# Patient Record
Sex: Male | Born: 1945 | Race: White | Hispanic: No | Marital: Married | Smoking: Never smoker
Health system: Southern US, Community
[De-identification: ages and names within clinical notes are randomized; demographics above are authoritative.]

## PROBLEM LIST (undated history)

## (undated) DIAGNOSIS — M75102 Unspecified rotator cuff tear or rupture of left shoulder, not specified as traumatic: Secondary | ICD-10-CM

## (undated) DIAGNOSIS — N189 Chronic kidney disease, unspecified: Secondary | ICD-10-CM

## (undated) DIAGNOSIS — E785 Hyperlipidemia, unspecified: Secondary | ICD-10-CM

## (undated) DIAGNOSIS — M5416 Radiculopathy, lumbar region: Secondary | ICD-10-CM

## (undated) DIAGNOSIS — R030 Elevated blood-pressure reading, without diagnosis of hypertension: Secondary | ICD-10-CM

## (undated) DIAGNOSIS — D649 Anemia, unspecified: Secondary | ICD-10-CM

## (undated) HISTORY — DX: Hyperlipidemia, unspecified: E78.5

## (undated) HISTORY — PX: CARDIAC CATHETERIZATION: SHX172

## (undated) HISTORY — DX: Elevated blood-pressure reading, without diagnosis of hypertension: R03.0

## (undated) HISTORY — DX: Radiculopathy, lumbar region: M54.16

## (undated) HISTORY — DX: Anemia, unspecified: D64.9

## (undated) HISTORY — DX: Chronic kidney disease, unspecified: N18.9

## (undated) HISTORY — PX: COLONOSCOPY: SHX174

---

## 2001-10-26 ENCOUNTER — Ambulatory Visit (HOSPITAL_COMMUNITY): Admission: RE | Admit: 2001-10-26 | Discharge: 2001-10-26 | Payer: Self-pay | Admitting: Gastroenterology

## 2011-06-16 DIAGNOSIS — M719 Bursopathy, unspecified: Secondary | ICD-10-CM | POA: Diagnosis not present

## 2011-06-16 DIAGNOSIS — M999 Biomechanical lesion, unspecified: Secondary | ICD-10-CM | POA: Diagnosis not present

## 2011-06-16 DIAGNOSIS — M531 Cervicobrachial syndrome: Secondary | ICD-10-CM | POA: Diagnosis not present

## 2011-06-16 DIAGNOSIS — IMO0001 Reserved for inherently not codable concepts without codable children: Secondary | ICD-10-CM | POA: Diagnosis not present

## 2011-06-16 DIAGNOSIS — M9981 Other biomechanical lesions of cervical region: Secondary | ICD-10-CM | POA: Diagnosis not present

## 2011-06-18 DIAGNOSIS — M531 Cervicobrachial syndrome: Secondary | ICD-10-CM | POA: Diagnosis not present

## 2011-06-18 DIAGNOSIS — M67919 Unspecified disorder of synovium and tendon, unspecified shoulder: Secondary | ICD-10-CM | POA: Diagnosis not present

## 2011-06-18 DIAGNOSIS — IMO0001 Reserved for inherently not codable concepts without codable children: Secondary | ICD-10-CM | POA: Diagnosis not present

## 2011-06-18 DIAGNOSIS — M9981 Other biomechanical lesions of cervical region: Secondary | ICD-10-CM | POA: Diagnosis not present

## 2011-06-18 DIAGNOSIS — M999 Biomechanical lesion, unspecified: Secondary | ICD-10-CM | POA: Diagnosis not present

## 2011-06-23 DIAGNOSIS — M9981 Other biomechanical lesions of cervical region: Secondary | ICD-10-CM | POA: Diagnosis not present

## 2011-06-23 DIAGNOSIS — M67919 Unspecified disorder of synovium and tendon, unspecified shoulder: Secondary | ICD-10-CM | POA: Diagnosis not present

## 2011-06-23 DIAGNOSIS — M719 Bursopathy, unspecified: Secondary | ICD-10-CM | POA: Diagnosis not present

## 2011-06-23 DIAGNOSIS — M531 Cervicobrachial syndrome: Secondary | ICD-10-CM | POA: Diagnosis not present

## 2011-06-23 DIAGNOSIS — M999 Biomechanical lesion, unspecified: Secondary | ICD-10-CM | POA: Diagnosis not present

## 2011-06-23 DIAGNOSIS — IMO0001 Reserved for inherently not codable concepts without codable children: Secondary | ICD-10-CM | POA: Diagnosis not present

## 2011-06-25 DIAGNOSIS — M719 Bursopathy, unspecified: Secondary | ICD-10-CM | POA: Diagnosis not present

## 2011-06-25 DIAGNOSIS — IMO0001 Reserved for inherently not codable concepts without codable children: Secondary | ICD-10-CM | POA: Diagnosis not present

## 2011-06-25 DIAGNOSIS — M531 Cervicobrachial syndrome: Secondary | ICD-10-CM | POA: Diagnosis not present

## 2011-06-25 DIAGNOSIS — M999 Biomechanical lesion, unspecified: Secondary | ICD-10-CM | POA: Diagnosis not present

## 2011-06-25 DIAGNOSIS — M9981 Other biomechanical lesions of cervical region: Secondary | ICD-10-CM | POA: Diagnosis not present

## 2011-06-30 DIAGNOSIS — M67919 Unspecified disorder of synovium and tendon, unspecified shoulder: Secondary | ICD-10-CM | POA: Diagnosis not present

## 2011-06-30 DIAGNOSIS — M999 Biomechanical lesion, unspecified: Secondary | ICD-10-CM | POA: Diagnosis not present

## 2011-06-30 DIAGNOSIS — M9981 Other biomechanical lesions of cervical region: Secondary | ICD-10-CM | POA: Diagnosis not present

## 2011-06-30 DIAGNOSIS — M719 Bursopathy, unspecified: Secondary | ICD-10-CM | POA: Diagnosis not present

## 2011-06-30 DIAGNOSIS — M531 Cervicobrachial syndrome: Secondary | ICD-10-CM | POA: Diagnosis not present

## 2011-06-30 DIAGNOSIS — IMO0001 Reserved for inherently not codable concepts without codable children: Secondary | ICD-10-CM | POA: Diagnosis not present

## 2011-07-07 DIAGNOSIS — M719 Bursopathy, unspecified: Secondary | ICD-10-CM | POA: Diagnosis not present

## 2011-07-07 DIAGNOSIS — M531 Cervicobrachial syndrome: Secondary | ICD-10-CM | POA: Diagnosis not present

## 2011-07-07 DIAGNOSIS — M9981 Other biomechanical lesions of cervical region: Secondary | ICD-10-CM | POA: Diagnosis not present

## 2011-07-07 DIAGNOSIS — M999 Biomechanical lesion, unspecified: Secondary | ICD-10-CM | POA: Diagnosis not present

## 2011-07-07 DIAGNOSIS — M67919 Unspecified disorder of synovium and tendon, unspecified shoulder: Secondary | ICD-10-CM | POA: Diagnosis not present

## 2011-07-07 DIAGNOSIS — IMO0001 Reserved for inherently not codable concepts without codable children: Secondary | ICD-10-CM | POA: Diagnosis not present

## 2011-07-09 DIAGNOSIS — M531 Cervicobrachial syndrome: Secondary | ICD-10-CM | POA: Diagnosis not present

## 2011-07-09 DIAGNOSIS — M719 Bursopathy, unspecified: Secondary | ICD-10-CM | POA: Diagnosis not present

## 2011-07-09 DIAGNOSIS — M999 Biomechanical lesion, unspecified: Secondary | ICD-10-CM | POA: Diagnosis not present

## 2011-07-09 DIAGNOSIS — M9981 Other biomechanical lesions of cervical region: Secondary | ICD-10-CM | POA: Diagnosis not present

## 2011-07-09 DIAGNOSIS — M67919 Unspecified disorder of synovium and tendon, unspecified shoulder: Secondary | ICD-10-CM | POA: Diagnosis not present

## 2011-07-09 DIAGNOSIS — IMO0001 Reserved for inherently not codable concepts without codable children: Secondary | ICD-10-CM | POA: Diagnosis not present

## 2011-07-14 DIAGNOSIS — M999 Biomechanical lesion, unspecified: Secondary | ICD-10-CM | POA: Diagnosis not present

## 2011-07-14 DIAGNOSIS — M531 Cervicobrachial syndrome: Secondary | ICD-10-CM | POA: Diagnosis not present

## 2011-07-14 DIAGNOSIS — M9981 Other biomechanical lesions of cervical region: Secondary | ICD-10-CM | POA: Diagnosis not present

## 2011-07-14 DIAGNOSIS — IMO0001 Reserved for inherently not codable concepts without codable children: Secondary | ICD-10-CM | POA: Diagnosis not present

## 2011-07-14 DIAGNOSIS — M719 Bursopathy, unspecified: Secondary | ICD-10-CM | POA: Diagnosis not present

## 2011-07-21 DIAGNOSIS — M67919 Unspecified disorder of synovium and tendon, unspecified shoulder: Secondary | ICD-10-CM | POA: Diagnosis not present

## 2011-07-21 DIAGNOSIS — M9981 Other biomechanical lesions of cervical region: Secondary | ICD-10-CM | POA: Diagnosis not present

## 2011-07-21 DIAGNOSIS — IMO0001 Reserved for inherently not codable concepts without codable children: Secondary | ICD-10-CM | POA: Diagnosis not present

## 2011-07-21 DIAGNOSIS — M999 Biomechanical lesion, unspecified: Secondary | ICD-10-CM | POA: Diagnosis not present

## 2011-07-21 DIAGNOSIS — M719 Bursopathy, unspecified: Secondary | ICD-10-CM | POA: Diagnosis not present

## 2011-07-21 DIAGNOSIS — M531 Cervicobrachial syndrome: Secondary | ICD-10-CM | POA: Diagnosis not present

## 2011-07-28 DIAGNOSIS — M719 Bursopathy, unspecified: Secondary | ICD-10-CM | POA: Diagnosis not present

## 2011-07-28 DIAGNOSIS — M531 Cervicobrachial syndrome: Secondary | ICD-10-CM | POA: Diagnosis not present

## 2011-07-28 DIAGNOSIS — M9981 Other biomechanical lesions of cervical region: Secondary | ICD-10-CM | POA: Diagnosis not present

## 2011-07-28 DIAGNOSIS — IMO0001 Reserved for inherently not codable concepts without codable children: Secondary | ICD-10-CM | POA: Diagnosis not present

## 2011-07-28 DIAGNOSIS — M999 Biomechanical lesion, unspecified: Secondary | ICD-10-CM | POA: Diagnosis not present

## 2011-07-28 DIAGNOSIS — M67919 Unspecified disorder of synovium and tendon, unspecified shoulder: Secondary | ICD-10-CM | POA: Diagnosis not present

## 2011-08-11 DIAGNOSIS — IMO0001 Reserved for inherently not codable concepts without codable children: Secondary | ICD-10-CM | POA: Diagnosis not present

## 2011-08-11 DIAGNOSIS — M999 Biomechanical lesion, unspecified: Secondary | ICD-10-CM | POA: Diagnosis not present

## 2011-08-11 DIAGNOSIS — M719 Bursopathy, unspecified: Secondary | ICD-10-CM | POA: Diagnosis not present

## 2011-08-11 DIAGNOSIS — M9981 Other biomechanical lesions of cervical region: Secondary | ICD-10-CM | POA: Diagnosis not present

## 2011-08-11 DIAGNOSIS — M531 Cervicobrachial syndrome: Secondary | ICD-10-CM | POA: Diagnosis not present

## 2011-08-11 DIAGNOSIS — M67919 Unspecified disorder of synovium and tendon, unspecified shoulder: Secondary | ICD-10-CM | POA: Diagnosis not present

## 2011-09-01 DIAGNOSIS — M999 Biomechanical lesion, unspecified: Secondary | ICD-10-CM | POA: Diagnosis not present

## 2011-09-01 DIAGNOSIS — M9981 Other biomechanical lesions of cervical region: Secondary | ICD-10-CM | POA: Diagnosis not present

## 2011-09-01 DIAGNOSIS — IMO0001 Reserved for inherently not codable concepts without codable children: Secondary | ICD-10-CM | POA: Diagnosis not present

## 2011-09-01 DIAGNOSIS — M531 Cervicobrachial syndrome: Secondary | ICD-10-CM | POA: Diagnosis not present

## 2011-09-01 DIAGNOSIS — M719 Bursopathy, unspecified: Secondary | ICD-10-CM | POA: Diagnosis not present

## 2011-09-08 ENCOUNTER — Observation Stay (HOSPITAL_COMMUNITY)
Admission: EM | Admit: 2011-09-08 | Discharge: 2011-09-08 | Disposition: A | Discharge status: Home / Self Care | Payer: Medicare Other | Attending: Emergency Medicine | Admitting: Emergency Medicine

## 2011-09-08 ENCOUNTER — Emergency Department (HOSPITAL_COMMUNITY): Payer: Medicare Other

## 2011-09-08 ENCOUNTER — Encounter (HOSPITAL_COMMUNITY): Payer: Self-pay | Admitting: *Deleted

## 2011-09-08 DIAGNOSIS — R0789 Other chest pain: Secondary | ICD-10-CM | POA: Diagnosis not present

## 2011-09-08 DIAGNOSIS — R079 Chest pain, unspecified: Principal | ICD-10-CM | POA: Insufficient documentation

## 2011-09-08 DIAGNOSIS — R9431 Abnormal electrocardiogram [ECG] [EKG]: Secondary | ICD-10-CM | POA: Diagnosis not present

## 2011-09-08 DIAGNOSIS — R072 Precordial pain: Secondary | ICD-10-CM | POA: Diagnosis not present

## 2011-09-08 LAB — BASIC METABOLIC PANEL
BUN: 25 mg/dL — ABNORMAL HIGH (ref 6–23)
CO2: 27 mEq/L (ref 19–32)
Chloride: 99 mEq/L (ref 96–112)
Creatinine, Ser: 1.19 mg/dL (ref 0.50–1.35)
GFR calc Af Amer: 72 mL/min — ABNORMAL LOW (ref >90)
Glucose, Bld: 107 mg/dL — ABNORMAL HIGH (ref 70–99)
Potassium: 3.9 mEq/L (ref 3.5–5.1)

## 2011-09-08 LAB — CBC
HCT: 40 % (ref 39.0–52.0)
Hemoglobin: 14.1 g/dL (ref 13.0–17.0)
MCV: 90.1 fL (ref 78.0–100.0)
RBC: 4.44 MIL/uL (ref 4.22–5.81)
RDW: 11.7 % (ref 11.5–15.5)
WBC: 7.5 10*3/uL (ref 4.0–10.5)

## 2011-09-08 LAB — CARDIAC PANEL(CRET KIN+CKTOT+MB+TROPI)
CK, MB: 2 ng/mL (ref 0.3–4.0)
Relative Index: INVALID (ref 0.0–2.5)
Total CK: 85 U/L (ref 7–232)
Troponin I: <0.3 ng/mL (ref <0.30)

## 2011-09-08 LAB — PRO B NATRIURETIC PEPTIDE: Pro B Natriuretic peptide (BNP): 88.4 pg/mL (ref 0–125)

## 2011-09-08 MED ORDER — IBUPROFEN 600 MG PO TABS: 600.0000 mg | ORAL_TABLET | Freq: Four times a day (QID) | ORAL | Status: AC | PRN

## 2011-09-08 MED ORDER — ASPIRIN 81 MG PO CHEW: 324.0000 mg | CHEWABLE_TABLET | Freq: Once | ORAL | Status: DC

## 2011-09-08 NOTE — ED Notes (Signed)
Complaining of pain located in sternum. Denies chest pain. States went to PCP at which time had EKG done. Describes as intermittent, pulling pain with movement. Started at Friday morning. Waited for Monday for physician office to open. Denies pain at this time

## 2011-09-08 NOTE — ED Provider Notes (Signed)
History     CSN: [id number]  Arrival date & time 09/08/11  1618   First MD Initiated Contact with Patient 09/08/11 1636      Chief Complaint  Patient presents with  . Chest Pain    (Consider location/radiation/quality/duration/timing/severity/associated sxs/prior treatment) Patient is a 66 y.o. male presenting with chest pain. The history is provided by the patient, the EMS personnel and medical records.  Chest Pain The chest pain began 3 - 5 days ago (6/28). Chest pain occurs intermittently. The chest pain is unchanged. At its most intense, the pain is at 4/10. The pain is currently at 0/10. The quality of the pain is described as pressure-like. The pain does not radiate. Chest pain is worsened by certain positions. Pertinent negatives for primary symptoms include no fever, no fatigue, no shortness of breath, no cough, no palpitations, no abdominal pain, no nausea, no vomiting and no dizziness.  Pertinent negatives for associated symptoms include no diaphoresis, no near-syncope, no numbness, no paroxysmal nocturnal dyspnea and no weakness. He tried nothing for the symptoms. Risk factors include male gender.  Pertinent negatives for past medical history include no CAD, no DVT and no PE.  Pertinent negatives for family medical history include: no early MI in family.  Procedure history is positive for cardiac catheterization (negative 12 years ago) and exercise treadmill test (12 years ago; abnormal EKG findings).     History reviewed. No pertinent past medical history.  History reviewed. No pertinent past surgical history.  No family history on file.  History  Substance Use Topics  . Smoking status: Never Smoker   . Smokeless tobacco: Not on file  . Alcohol Use: No      Review of Systems  Unable to perform ROS Constitutional: Negative for fever, chills, diaphoresis and fatigue.  HENT: Negative for congestion and rhinorrhea.   Respiratory: Negative for cough, chest tightness  and shortness of breath.   Cardiovascular: Positive for chest pain. Negative for palpitations, leg swelling and near-syncope.  Gastrointestinal: Negative for nausea, vomiting and abdominal pain.  Musculoskeletal: Negative for myalgias and back pain.  Skin: Negative for color change and rash.  Neurological: Negative for dizziness, weakness, light-headedness, numbness and headaches.  All other systems reviewed and are negative.    Allergies  Review of patient's allergies indicates no known allergies.  Home Medications  No current outpatient prescriptions on file.  BP 138/86  Temp 98.4 F (36.9 C) (Oral)  Resp 18  Wt 145 lb (65.772 kg)  SpO2 99%  Physical Exam  Nursing note and vitals reviewed. Constitutional: He is oriented to person, place, and time. He appears well-developed and well-nourished.  HENT:  Head: Normocephalic and atraumatic.  Eyes: EOM are normal. Pupils are equal, round, and reactive to light.  Cardiovascular: Normal rate, regular rhythm, normal heart sounds and intact distal pulses.   Pulmonary/Chest: Effort normal and breath sounds normal. No respiratory distress. He exhibits no tenderness.  Abdominal: Soft. There is no tenderness.  Musculoskeletal: He exhibits no edema.  Neurological: He is alert and oriented to person, place, and time.  Skin: Skin is warm and dry.  Psychiatric: He has a normal mood and affect.    ED Course  Procedures (including critical care time)  Labs Reviewed  BASIC METABOLIC PANEL - Abnormal; Notable for the following:    Glucose, Bld 107 (*)     BUN 25 (*)     GFR calc non Af Amer 62 (*)     GFR calc Af Denyse Dago  72 (*)     All other components within normal limits  SEDIMENTATION RATE - Abnormal; Notable for the following:    Sed Rate 59 (*)     All other components within normal limits  CBC  CARDIAC PANEL(CRET KIN+CKTOT+MB+TROPI)  PRO B NATRIURETIC PEPTIDE  CARDIAC PANEL(CRET KIN+CKTOT+MB+TROPI)   Dg Chest 2 View  09/08/2011   *RADIOLOGY REPORT*  Clinical Data: Mid chest pain.  Abnormal EKG.  CHEST - 2 VIEW 5:25 p.m.  Comparison: 09/08/2011 at 3:00 p.m.  Findings: Heart size and vascularity are normal and the lungs are clear.  No acute osseous abnormality.  IMPRESSION: No acute disease.  Original Report Authenticated By: Gwynn Burly, M.D.     1. Chest pain      Date: 09/08/2011  Rate: 98  Rhythm: normal sinus rhythm  QRS Axis: normal  Intervals: normal  ST/T Wave abnormalities: ST elevations inferiorly ~1 mm leads II, III, aVF  Conduction Disutrbances:none  Narrative Interpretation: NSR, mild ST elevations in inferior leads, no reciprocal changes  Old EKG Reviewed: none available     MDM  66 yo M with no past medical history presents with intermittent CP since 6/28. Says happens often spontaneously, sometimes with movements in certain directions, lasts for a couple of minutes before resolving spontaneously, occasionally relieved by sitting up straight or standing. Is mid-sternal pressure, at worst 4-5/10, non radiating, with no other associated symptoms. Has never had previous similar pain before. Says ~12 years ago was having a treadmill test where they were looking at an EKG (sounds like stress test, but unknown reason, b/c wasn't having any CP or SOB) and was told he had an abnormal EKG, so underwent a cath, which was negative. Did not have any interventions performed, and does not currently see a cardiologist. Denies any recent cough/congestion or other URI sxs, no SOB, no exertional or pleuritic component, is unable to reproduce with palpation, no N/V abd pain, or any other complaints. Was seen by his PCP today, where an EKG in clinic showed mild elevations in inferior leads, so pt was sent to ED for further evaluation. Currently is CP free. Exam is unremarkable; pain is not reproducible with palpation or movement. Repeat EKG here as above; though has mild STE, no reciprocal changes and pt pain free at this  time, Code STEMI was not called. Story atypical for ACS. Will give asa, check labs, CXR and consult cardiology (Dr Katrinka Blazing with [facility] cardiology notified of pt prior to transfer here).   Dr Katrinka Blazing has seen patient, and do not think pt is have ACS. Feel that by story, is more consistent with inflammatory process (ie: pericarditis). Recommends checking two sets of enzymes, and having pt f/u with him in clinic.  Initial set of cardiac markers unremarkable. Pt remains pain free. Will send to obs while waiting for second set of enzymes. Pt and family updated on plan.      Theotis Burrow, MD 09/09/11 [phone]

## 2011-09-08 NOTE — ED Notes (Signed)
Dr. Verdis Prime was notified of patient arrival

## 2011-09-08 NOTE — ED Notes (Signed)
Patient states intermittant chest pain since Friday, patient at MD office today and post EKG was brought to hospital via EMS for abnormal EKG,  Patient states no pain at this time, patient with +PMS in all extremities, patient in NSR at this time,

## 2011-09-08 NOTE — ED Notes (Signed)
Cards MD at bedside

## 2011-09-08 NOTE — ED Notes (Signed)
   Admit date: 09/08/2011 Referring Physician  George Gosselin, MD Primary Physician  Same Primary Cardiologist  George Both, III, MD Reason for Consultation : Chest pain  ASSESSMENT: 1. Chest pain, with pleuritic and musculoskeletal features.  2. EKG with 1/2 mm of ST elevation in 2, 3, and aVF without reciprocal changes  3. History of a syncopal episode in April 2012 with associated small subarachnoid hemorrhage due to trauma. Cardiac workup at that time including echocardiogram was "normal". No EKGs are available.  4. Hyperlipidemia  PLAN: 1. Serial point-of-care cardiac markers to rule out evidence of injury.  2. Repeat EKG in approximately one hour.  3. If EKG and markers unremarkable, given the patient's history no further ischemic evaluation seems indicated.  4. Since we are concerned about the possibility of an inflammatory process, possibly  pericarditis, consideration of an outpatient echocardiogram is reasonable.  5. The patient needs a course of anti-inflammatory therapy, perhaps ibuprofen (400 mg) or Indocin (50 mg) 3 times per day with food for 5-7 days.   HPI: This is a very nice gentleman who presents with a three-day history of localized sternal discomfort. He classifies it as a sharp pain that is more noticeable when he is lying down. It is even worse when he lays on his stomach. Standing and changing positions causes it to go away. Activity does not precipitate discomfort. There is no chest wall tenderness. He denies chills and fever. There is no radiation. He denies dyspnea. There is no diaphoresis. When the episodes occur they last minutes and is relieved by positional changes.   PMH:  1. White count hypertension; 2. Hyperlipidemia; 3. Syncopal episode with head trauma, 2012  PSH:  Cardiac catheterization 1999, no abnormalities found  Allergies: None Prior to Admit Meds: 1. Omega-3 fish oil                                      2.  81 mg aspirin daily. Fam HX:    Mother died of breast cancer age 59. Father died of "old age. Social HX:   Doesn't smoke. Doesn't drink. 3 children. Married. Retired. Review of Systems: Unremarkable  Physical Exam: Blood pressure 134/81, pulse 89, temperature 98.4 F (36.9 C), temperature source Oral, resp. rate 20, weight 65.772 kg (145 lb), SpO2 97.00%. Weight change:   Well-developed well-nourished.  Skin is warm and dry. Color is good.  No carotid bruits are heard.  Lungs are clear auscultation and percussion.  Cardiac exam is unremarkable. No rub, murmur, or gallop is heard.  Abdomen is soft without bruits.  Extremities reveal no edema. Pulses and sensation are equal bilaterally in both upper and lower extremities.  Neurological exam is intact  Labs: Point-of-care markers are pending     Radiology:  Dg Chest 2 View  09/08/2011  *RADIOLOGY REPORT*  Clinical Data: Mid chest pain.  Abnormal EKG.  CHEST - 2 VIEW 5:25 p.m.  Comparison: 09/08/2011 at 3:00 p.m.  Findings: Heart size and vascularity are normal and the lungs are clear.  No acute osseous abnormality.  IMPRESSION: No acute disease.  Original Report Authenticated By: Gwynn Burly, M.D.   EKG: Normal sinus rhythm. 1/2 mm ST elevation 23 and aVF. Sinus tachycardia.    George Mckay 09/08/2011 6:09 PM

## 2011-09-08 NOTE — ED Provider Notes (Signed)
8:30 PM Assumed care of patient in the CDU from Dr. Leary Roca and Dr Rosalia Hammers.  Patient presented to the ED with a chief complaint of midsternal chest pain that has been present for the past 4 days.  EKG findings showed mild ST elevation in II, III, and AVF.  Initial cardiac markers negative.  Plan is for patient to be discharged home with Cardiology follow up if second set of markers are negative.  Patient has been evaluated by Dr. Katrinka Blazing with [facility] in the ED prior to arrival in the CDU.  Reassessed patient.  He is not having any chest pain at this time.  Patient alert and orientated x 3, Heart RRR, Lungs CTAB, no LE edema, DP pulses 2+ bilaterally.  Patient currently awaiting second set of cardiac markers.  10:30 PM Second sets of cardiac markers are negative.  Patient continues to be chest pain free.  Will discharge patient home with follow up with Dr. Katrinka Blazing with [facility].  Patient in agreement with the plan.  Return precautions discussed.    Pascal Lux [place], PA-C 09/09/11 0005

## 2011-09-08 NOTE — ED Notes (Signed)
Patient in NAD at this time, denies chest pain, patient with +PMS in all extremities.

## 2011-09-08 NOTE — Discharge Instructions (Signed)
Follow up with Dr. Katrinka Blazing with Cardiology.  Call tomorrow and make an appointment.  Read instructions below for reasons to return to the Emergency Department. It is recommended that your follow up with your Primary Care Doctor in regards to today's visit. If you do not have a doctor, use the resource guide listed below to help you find one.   Chest Pain (Nonspecific)  HOME CARE INSTRUCTIONS  For the next few days, avoid physical activities that bring on chest pain. Continue physical activities as directed.  Do not smoke cigarettes or drink alcohol until your symptoms are gone.  Only take over-the-counter or prescription medicine for pain, discomfort, or fever as directed by your caregiver.  Follow your caregiver's suggestions for further testing if your chest pain does not go away.  Keep any follow-up appointments you made. If you do not go to an appointment, you could develop lasting (chronic) problems with pain. If there is any problem keeping an appointment, you must call to reschedule.  SEEK MEDICAL CARE IF:  You think you are having problems from the medicine you are taking. Read your medicine instructions carefully.  Your chest pain does not go away, even after treatment.  You develop a rash with blisters on your chest.  SEEK IMMEDIATE MEDICAL CARE IF:  You have increased chest pain or pain that spreads to your arm, neck, jaw, back, or belly (abdomen).  You develop shortness of breath, an increasing cough, or you are coughing up blood.  You have severe back or abdominal pain, feel sick to your stomach (nauseous) or throw up (vomit).  You develop severe weakness, fainting, or chills.  You have an oral temperature above 102 F (38.9 C), not controlled by medicine.   THIS IS AN EMERGENCY. Do not wait to see if the pain will go away. Get medical help at once. Call your local emergency services (911 in [place] Do not drive yourself to the hospital.

## 2011-09-08 NOTE — ED Notes (Signed)
Cardiology MD to bedside.  Pt to x-ray.

## 2011-09-08 NOTE — ED Notes (Signed)
MD to bedside.

## 2011-09-09 DIAGNOSIS — R0602 Shortness of breath: Secondary | ICD-10-CM | POA: Diagnosis not present

## 2011-09-12 NOTE — ED Provider Notes (Signed)
  I performed a history and physical examination of George Mckay and discussed his management with Dr. Leary Roca and Mendel Ryder.  I agree with the history, physical, assessment, and plan of care, with the following exceptions: None  I was present for the following procedures: None Time Spent in Critical Care of the patient: None Time spent in discussions with the patient and family: 10  Jeanni Allshouse Corlis Leak, MD 09/12/11 [phone]

## 2011-09-12 NOTE — ED Provider Notes (Signed)
History/physical exam/procedure(s) were performed by non-physician practitioner and as supervising physician I was immediately available for consultation/collaboration. I have reviewed all notes and am in agreement with care and plan.   Hilario Quarry, MD 09/12/11 [phone]

## 2011-10-20 DIAGNOSIS — R071 Chest pain on breathing: Secondary | ICD-10-CM | POA: Diagnosis not present

## 2011-10-22 DIAGNOSIS — Z09 Encounter for follow-up examination after completed treatment for conditions other than malignant neoplasm: Secondary | ICD-10-CM | POA: Diagnosis not present

## 2011-10-22 DIAGNOSIS — Z86010 Personal history of colonic polyps: Secondary | ICD-10-CM | POA: Diagnosis not present

## 2011-11-24 DIAGNOSIS — M9981 Other biomechanical lesions of cervical region: Secondary | ICD-10-CM | POA: Diagnosis not present

## 2011-11-24 DIAGNOSIS — M999 Biomechanical lesion, unspecified: Secondary | ICD-10-CM | POA: Diagnosis not present

## 2011-11-24 DIAGNOSIS — S139XXA Sprain of joints and ligaments of unspecified parts of neck, initial encounter: Secondary | ICD-10-CM | POA: Diagnosis not present

## 2012-01-15 DIAGNOSIS — Z0000 Encounter for general adult medical examination without abnormal findings: Secondary | ICD-10-CM | POA: Diagnosis not present

## 2012-01-15 DIAGNOSIS — Z79899 Other long term (current) drug therapy: Secondary | ICD-10-CM | POA: Diagnosis not present

## 2012-01-15 DIAGNOSIS — Z125 Encounter for screening for malignant neoplasm of prostate: Secondary | ICD-10-CM | POA: Diagnosis not present

## 2012-01-15 DIAGNOSIS — Z23 Encounter for immunization: Secondary | ICD-10-CM | POA: Diagnosis not present

## 2012-01-15 DIAGNOSIS — Z1331 Encounter for screening for depression: Secondary | ICD-10-CM | POA: Diagnosis not present

## 2012-01-15 DIAGNOSIS — E785 Hyperlipidemia, unspecified: Secondary | ICD-10-CM | POA: Diagnosis not present

## 2012-01-29 DIAGNOSIS — R799 Abnormal finding of blood chemistry, unspecified: Secondary | ICD-10-CM | POA: Diagnosis not present

## 2012-04-29 DIAGNOSIS — L723 Sebaceous cyst: Secondary | ICD-10-CM | POA: Diagnosis not present

## 2012-04-29 DIAGNOSIS — L719 Rosacea, unspecified: Secondary | ICD-10-CM | POA: Diagnosis not present

## 2012-06-01 DIAGNOSIS — M999 Biomechanical lesion, unspecified: Secondary | ICD-10-CM | POA: Diagnosis not present

## 2012-06-01 DIAGNOSIS — M9981 Other biomechanical lesions of cervical region: Secondary | ICD-10-CM | POA: Diagnosis not present

## 2012-06-01 DIAGNOSIS — M48061 Spinal stenosis, lumbar region without neurogenic claudication: Secondary | ICD-10-CM | POA: Diagnosis not present

## 2012-06-01 DIAGNOSIS — M531 Cervicobrachial syndrome: Secondary | ICD-10-CM | POA: Diagnosis not present

## 2012-06-03 DIAGNOSIS — M48061 Spinal stenosis, lumbar region without neurogenic claudication: Secondary | ICD-10-CM | POA: Diagnosis not present

## 2012-06-03 DIAGNOSIS — M531 Cervicobrachial syndrome: Secondary | ICD-10-CM | POA: Diagnosis not present

## 2012-06-03 DIAGNOSIS — M999 Biomechanical lesion, unspecified: Secondary | ICD-10-CM | POA: Diagnosis not present

## 2012-06-03 DIAGNOSIS — M9981 Other biomechanical lesions of cervical region: Secondary | ICD-10-CM | POA: Diagnosis not present

## 2012-06-07 DIAGNOSIS — M531 Cervicobrachial syndrome: Secondary | ICD-10-CM | POA: Diagnosis not present

## 2012-06-07 DIAGNOSIS — M9981 Other biomechanical lesions of cervical region: Secondary | ICD-10-CM | POA: Diagnosis not present

## 2012-06-07 DIAGNOSIS — M48061 Spinal stenosis, lumbar region without neurogenic claudication: Secondary | ICD-10-CM | POA: Diagnosis not present

## 2012-06-07 DIAGNOSIS — M999 Biomechanical lesion, unspecified: Secondary | ICD-10-CM | POA: Diagnosis not present

## 2012-06-10 DIAGNOSIS — M531 Cervicobrachial syndrome: Secondary | ICD-10-CM | POA: Diagnosis not present

## 2012-06-10 DIAGNOSIS — M48061 Spinal stenosis, lumbar region without neurogenic claudication: Secondary | ICD-10-CM | POA: Diagnosis not present

## 2012-06-10 DIAGNOSIS — M999 Biomechanical lesion, unspecified: Secondary | ICD-10-CM | POA: Diagnosis not present

## 2012-06-10 DIAGNOSIS — M9981 Other biomechanical lesions of cervical region: Secondary | ICD-10-CM | POA: Diagnosis not present

## 2012-06-15 DIAGNOSIS — M9981 Other biomechanical lesions of cervical region: Secondary | ICD-10-CM | POA: Diagnosis not present

## 2012-06-15 DIAGNOSIS — M531 Cervicobrachial syndrome: Secondary | ICD-10-CM | POA: Diagnosis not present

## 2012-06-15 DIAGNOSIS — M48061 Spinal stenosis, lumbar region without neurogenic claudication: Secondary | ICD-10-CM | POA: Diagnosis not present

## 2012-06-15 DIAGNOSIS — M999 Biomechanical lesion, unspecified: Secondary | ICD-10-CM | POA: Diagnosis not present

## 2012-06-28 DIAGNOSIS — M25519 Pain in unspecified shoulder: Secondary | ICD-10-CM | POA: Diagnosis not present

## 2012-07-25 DIAGNOSIS — J069 Acute upper respiratory infection, unspecified: Secondary | ICD-10-CM | POA: Diagnosis not present

## 2013-01-20 DIAGNOSIS — N289 Disorder of kidney and ureter, unspecified: Secondary | ICD-10-CM | POA: Diagnosis not present

## 2013-01-20 DIAGNOSIS — Z0000 Encounter for general adult medical examination without abnormal findings: Secondary | ICD-10-CM | POA: Diagnosis not present

## 2013-01-20 DIAGNOSIS — M25519 Pain in unspecified shoulder: Secondary | ICD-10-CM | POA: Diagnosis not present

## 2013-01-20 DIAGNOSIS — E785 Hyperlipidemia, unspecified: Secondary | ICD-10-CM | POA: Diagnosis not present

## 2013-01-20 DIAGNOSIS — Z79899 Other long term (current) drug therapy: Secondary | ICD-10-CM | POA: Diagnosis not present

## 2013-01-20 DIAGNOSIS — Z125 Encounter for screening for malignant neoplasm of prostate: Secondary | ICD-10-CM | POA: Diagnosis not present

## 2013-01-20 DIAGNOSIS — Z1331 Encounter for screening for depression: Secondary | ICD-10-CM | POA: Diagnosis not present

## 2013-01-20 DIAGNOSIS — R7301 Impaired fasting glucose: Secondary | ICD-10-CM | POA: Diagnosis not present

## 2013-01-26 DIAGNOSIS — M67919 Unspecified disorder of synovium and tendon, unspecified shoulder: Secondary | ICD-10-CM | POA: Diagnosis not present

## 2013-08-05 DIAGNOSIS — H25039 Anterior subcapsular polar age-related cataract, unspecified eye: Secondary | ICD-10-CM | POA: Diagnosis not present

## 2013-08-15 DIAGNOSIS — M67919 Unspecified disorder of synovium and tendon, unspecified shoulder: Secondary | ICD-10-CM | POA: Diagnosis not present

## 2013-08-15 DIAGNOSIS — M719 Bursopathy, unspecified: Secondary | ICD-10-CM | POA: Diagnosis not present

## 2013-08-29 DIAGNOSIS — S335XXA Sprain of ligaments of lumbar spine, initial encounter: Secondary | ICD-10-CM | POA: Diagnosis not present

## 2013-08-29 DIAGNOSIS — M999 Biomechanical lesion, unspecified: Secondary | ICD-10-CM | POA: Diagnosis not present

## 2013-08-29 DIAGNOSIS — S239XXA Sprain of unspecified parts of thorax, initial encounter: Secondary | ICD-10-CM | POA: Diagnosis not present

## 2013-08-29 DIAGNOSIS — S338XXA Sprain of other parts of lumbar spine and pelvis, initial encounter: Secondary | ICD-10-CM | POA: Diagnosis not present

## 2013-08-31 DIAGNOSIS — S239XXA Sprain of unspecified parts of thorax, initial encounter: Secondary | ICD-10-CM | POA: Diagnosis not present

## 2013-08-31 DIAGNOSIS — S338XXA Sprain of other parts of lumbar spine and pelvis, initial encounter: Secondary | ICD-10-CM | POA: Diagnosis not present

## 2013-08-31 DIAGNOSIS — M999 Biomechanical lesion, unspecified: Secondary | ICD-10-CM | POA: Diagnosis not present

## 2013-08-31 DIAGNOSIS — S335XXA Sprain of ligaments of lumbar spine, initial encounter: Secondary | ICD-10-CM | POA: Diagnosis not present

## 2013-09-01 DIAGNOSIS — S239XXA Sprain of unspecified parts of thorax, initial encounter: Secondary | ICD-10-CM | POA: Diagnosis not present

## 2013-09-01 DIAGNOSIS — S338XXA Sprain of other parts of lumbar spine and pelvis, initial encounter: Secondary | ICD-10-CM | POA: Diagnosis not present

## 2013-09-01 DIAGNOSIS — S335XXA Sprain of ligaments of lumbar spine, initial encounter: Secondary | ICD-10-CM | POA: Diagnosis not present

## 2013-09-01 DIAGNOSIS — M999 Biomechanical lesion, unspecified: Secondary | ICD-10-CM | POA: Diagnosis not present

## 2013-09-12 DIAGNOSIS — S239XXA Sprain of unspecified parts of thorax, initial encounter: Secondary | ICD-10-CM | POA: Diagnosis not present

## 2013-09-12 DIAGNOSIS — M999 Biomechanical lesion, unspecified: Secondary | ICD-10-CM | POA: Diagnosis not present

## 2013-09-12 DIAGNOSIS — S335XXA Sprain of ligaments of lumbar spine, initial encounter: Secondary | ICD-10-CM | POA: Diagnosis not present

## 2013-09-12 DIAGNOSIS — S338XXA Sprain of other parts of lumbar spine and pelvis, initial encounter: Secondary | ICD-10-CM | POA: Diagnosis not present

## 2013-09-14 DIAGNOSIS — S335XXA Sprain of ligaments of lumbar spine, initial encounter: Secondary | ICD-10-CM | POA: Diagnosis not present

## 2013-09-14 DIAGNOSIS — S338XXA Sprain of other parts of lumbar spine and pelvis, initial encounter: Secondary | ICD-10-CM | POA: Diagnosis not present

## 2013-09-14 DIAGNOSIS — M999 Biomechanical lesion, unspecified: Secondary | ICD-10-CM | POA: Diagnosis not present

## 2013-09-14 DIAGNOSIS — S239XXA Sprain of unspecified parts of thorax, initial encounter: Secondary | ICD-10-CM | POA: Diagnosis not present

## 2013-09-19 DIAGNOSIS — M999 Biomechanical lesion, unspecified: Secondary | ICD-10-CM | POA: Diagnosis not present

## 2013-09-19 DIAGNOSIS — S338XXA Sprain of other parts of lumbar spine and pelvis, initial encounter: Secondary | ICD-10-CM | POA: Diagnosis not present

## 2013-09-19 DIAGNOSIS — S239XXA Sprain of unspecified parts of thorax, initial encounter: Secondary | ICD-10-CM | POA: Diagnosis not present

## 2013-09-19 DIAGNOSIS — S335XXA Sprain of ligaments of lumbar spine, initial encounter: Secondary | ICD-10-CM | POA: Diagnosis not present

## 2013-09-21 DIAGNOSIS — M999 Biomechanical lesion, unspecified: Secondary | ICD-10-CM | POA: Diagnosis not present

## 2013-09-21 DIAGNOSIS — S335XXA Sprain of ligaments of lumbar spine, initial encounter: Secondary | ICD-10-CM | POA: Diagnosis not present

## 2013-09-21 DIAGNOSIS — S239XXA Sprain of unspecified parts of thorax, initial encounter: Secondary | ICD-10-CM | POA: Diagnosis not present

## 2013-09-21 DIAGNOSIS — S338XXA Sprain of other parts of lumbar spine and pelvis, initial encounter: Secondary | ICD-10-CM | POA: Diagnosis not present

## 2013-10-15 ENCOUNTER — Encounter: Payer: Self-pay | Admitting: *Deleted

## 2013-10-15 DIAGNOSIS — E785 Hyperlipidemia, unspecified: Secondary | ICD-10-CM | POA: Insufficient documentation

## 2013-10-15 DIAGNOSIS — R030 Elevated blood-pressure reading, without diagnosis of hypertension: Secondary | ICD-10-CM | POA: Insufficient documentation

## 2014-01-18 DIAGNOSIS — M67912 Unspecified disorder of synovium and tendon, left shoulder: Secondary | ICD-10-CM | POA: Diagnosis not present

## 2014-02-01 DIAGNOSIS — M19012 Primary osteoarthritis, left shoulder: Secondary | ICD-10-CM | POA: Diagnosis not present

## 2014-02-06 DIAGNOSIS — M67912 Unspecified disorder of synovium and tendon, left shoulder: Secondary | ICD-10-CM | POA: Diagnosis not present

## 2014-02-08 DIAGNOSIS — L719 Rosacea, unspecified: Secondary | ICD-10-CM | POA: Diagnosis not present

## 2014-02-08 DIAGNOSIS — M25519 Pain in unspecified shoulder: Secondary | ICD-10-CM | POA: Diagnosis not present

## 2014-03-16 ENCOUNTER — Other Ambulatory Visit: Payer: Self-pay | Admitting: Orthopedic Surgery

## 2014-03-20 ENCOUNTER — Encounter (HOSPITAL_BASED_OUTPATIENT_CLINIC_OR_DEPARTMENT_OTHER): Payer: Self-pay | Admitting: *Deleted

## 2014-03-24 DIAGNOSIS — Z79899 Other long term (current) drug therapy: Secondary | ICD-10-CM | POA: Diagnosis not present

## 2014-03-24 DIAGNOSIS — Z125 Encounter for screening for malignant neoplasm of prostate: Secondary | ICD-10-CM | POA: Diagnosis not present

## 2014-03-24 DIAGNOSIS — Z0000 Encounter for general adult medical examination without abnormal findings: Secondary | ICD-10-CM | POA: Diagnosis not present

## 2014-03-24 DIAGNOSIS — Z23 Encounter for immunization: Secondary | ICD-10-CM | POA: Diagnosis not present

## 2014-03-24 DIAGNOSIS — R7301 Impaired fasting glucose: Secondary | ICD-10-CM | POA: Diagnosis not present

## 2014-03-24 DIAGNOSIS — E785 Hyperlipidemia, unspecified: Secondary | ICD-10-CM | POA: Diagnosis not present

## 2014-03-24 DIAGNOSIS — L719 Rosacea, unspecified: Secondary | ICD-10-CM | POA: Diagnosis not present

## 2014-03-24 DIAGNOSIS — N289 Disorder of kidney and ureter, unspecified: Secondary | ICD-10-CM | POA: Diagnosis not present

## 2014-03-27 ENCOUNTER — Encounter (HOSPITAL_BASED_OUTPATIENT_CLINIC_OR_DEPARTMENT_OTHER): Payer: Self-pay

## 2014-03-27 ENCOUNTER — Ambulatory Visit (HOSPITAL_BASED_OUTPATIENT_CLINIC_OR_DEPARTMENT_OTHER): Payer: Medicare Other | Admitting: Certified Registered"

## 2014-03-27 ENCOUNTER — Ambulatory Visit (HOSPITAL_BASED_OUTPATIENT_CLINIC_OR_DEPARTMENT_OTHER)
Admission: RE | Admit: 2014-03-27 | Discharge: 2014-03-27 | Disposition: A | Discharge status: Home / Self Care | Payer: Medicare Other | Source: Ambulatory Visit | Attending: Orthopedic Surgery | Admitting: Orthopedic Surgery

## 2014-03-27 ENCOUNTER — Encounter (HOSPITAL_BASED_OUTPATIENT_CLINIC_OR_DEPARTMENT_OTHER)
Admission: RE | Disposition: A | Discharge status: Home / Self Care | Payer: Self-pay | Source: Ambulatory Visit | Attending: Orthopedic Surgery

## 2014-03-27 DIAGNOSIS — E785 Hyperlipidemia, unspecified: Secondary | ICD-10-CM | POA: Insufficient documentation

## 2014-03-27 DIAGNOSIS — M19012 Primary osteoarthritis, left shoulder: Secondary | ICD-10-CM | POA: Insufficient documentation

## 2014-03-27 DIAGNOSIS — Z7982 Long term (current) use of aspirin: Secondary | ICD-10-CM | POA: Insufficient documentation

## 2014-03-27 DIAGNOSIS — M7592 Shoulder lesion, unspecified, left shoulder: Secondary | ICD-10-CM | POA: Insufficient documentation

## 2014-03-27 DIAGNOSIS — M25812 Other specified joint disorders, left shoulder: Secondary | ICD-10-CM | POA: Diagnosis not present

## 2014-03-27 DIAGNOSIS — G8918 Other acute postprocedural pain: Secondary | ICD-10-CM | POA: Diagnosis not present

## 2014-03-27 DIAGNOSIS — M75102 Unspecified rotator cuff tear or rupture of left shoulder, not specified as traumatic: Secondary | ICD-10-CM | POA: Insufficient documentation

## 2014-03-27 DIAGNOSIS — M7542 Impingement syndrome of left shoulder: Secondary | ICD-10-CM | POA: Insufficient documentation

## 2014-03-27 DIAGNOSIS — Z79899 Other long term (current) drug therapy: Secondary | ICD-10-CM | POA: Diagnosis not present

## 2014-03-27 DIAGNOSIS — I10 Essential (primary) hypertension: Secondary | ICD-10-CM | POA: Diagnosis not present

## 2014-03-27 DIAGNOSIS — M25512 Pain in left shoulder: Secondary | ICD-10-CM | POA: Diagnosis present

## 2014-03-27 HISTORY — PX: RESECTION DISTAL CLAVICAL: SHX5053

## 2014-03-27 HISTORY — PX: SHOULDER ARTHROSCOPY WITH SUBACROMIAL DECOMPRESSION: SHX5684

## 2014-03-27 HISTORY — DX: Unspecified rotator cuff tear or rupture of left shoulder, not specified as traumatic: M75.102

## 2014-03-27 SURGERY — SHOULDER ARTHROSCOPY WITH SUBACROMIAL DECOMPRESSION
Anesthesia: General | Site: Shoulder | Laterality: Left

## 2014-03-27 MED ORDER — CEFAZOLIN SODIUM-DEXTROSE 2-3 GM-% IV SOLR
INTRAVENOUS | Status: AC
  Filled 2014-03-27: qty 50

## 2014-03-27 MED ORDER — PROPOFOL 10 MG/ML IV BOLUS
INTRAVENOUS | Status: DC | PRN
  Administered 2014-03-27: 200 mg via INTRAVENOUS

## 2014-03-27 MED ORDER — OXYCODONE HCL 5 MG PO TABS: 5.0000 mg | ORAL_TABLET | Freq: Once | ORAL | Status: DC | PRN

## 2014-03-27 MED ORDER — MIDAZOLAM HCL 2 MG/2ML IJ SOLN
1.0000 mg | INTRAMUSCULAR | Status: DC | PRN
  Administered 2014-03-27: 2 mg via INTRAVENOUS

## 2014-03-27 MED ORDER — LACTATED RINGERS IV SOLN
INTRAVENOUS | Status: DC
  Administered 2014-03-27 (×2): via INTRAVENOUS

## 2014-03-27 MED ORDER — SUCCINYLCHOLINE CHLORIDE 20 MG/ML IJ SOLN
INTRAMUSCULAR | Status: DC | PRN
  Administered 2014-03-27: 120 mg via INTRAVENOUS

## 2014-03-27 MED ORDER — HYDROMORPHONE HCL 1 MG/ML IJ SOLN: 0.2500 mg | INTRAMUSCULAR | Status: DC | PRN

## 2014-03-27 MED ORDER — FENTANYL CITRATE 0.05 MG/ML IJ SOLN
INTRAMUSCULAR | Status: AC
  Filled 2014-03-27: qty 4

## 2014-03-27 MED ORDER — MIDAZOLAM HCL 2 MG/2ML IJ SOLN
INTRAMUSCULAR | Status: AC
  Filled 2014-03-27: qty 2

## 2014-03-27 MED ORDER — OXYCODONE-ACETAMINOPHEN 5-325 MG PO TABS: 1.0000 | ORAL_TABLET | ORAL | Status: DC | PRN

## 2014-03-27 MED ORDER — DOCUSATE SODIUM 100 MG PO CAPS: 100.0000 mg | ORAL_CAPSULE | Freq: Three times a day (TID) | ORAL | Status: DC | PRN

## 2014-03-27 MED ORDER — SODIUM CHLORIDE 0.9 % IR SOLN
Status: DC | PRN
  Administered 2014-03-27: 6000 mL

## 2014-03-27 MED ORDER — OXYCODONE HCL 5 MG/5ML PO SOLN: 5.0000 mg | Freq: Once | ORAL | Status: DC | PRN

## 2014-03-27 MED ORDER — ONDANSETRON HCL 4 MG/2ML IJ SOLN
4.0000 mg | Freq: Once | INTRAMUSCULAR | Status: DC | PRN
Start: 2014-03-27 — End: 2014-03-27

## 2014-03-27 MED ORDER — POVIDONE-IODINE 7.5 % EX SOLN: Freq: Once | CUTANEOUS | Status: DC

## 2014-03-27 MED ORDER — DEXAMETHASONE SODIUM PHOSPHATE 4 MG/ML IJ SOLN
INTRAMUSCULAR | Status: DC | PRN
  Administered 2014-03-27: 10 mg via INTRAVENOUS

## 2014-03-27 MED ORDER — EPHEDRINE SULFATE 50 MG/ML IJ SOLN
INTRAMUSCULAR | Status: DC | PRN
  Administered 2014-03-27: 10 mg via INTRAVENOUS

## 2014-03-27 MED ORDER — FENTANYL CITRATE 0.05 MG/ML IJ SOLN
INTRAMUSCULAR | Status: DC | PRN
  Administered 2014-03-27: 100 ug via INTRAVENOUS

## 2014-03-27 MED ORDER — MIDAZOLAM HCL 5 MG/5ML IJ SOLN
INTRAMUSCULAR | Status: DC | PRN
  Administered 2014-03-27: 2 mg via INTRAVENOUS

## 2014-03-27 MED ORDER — LIDOCAINE HCL (CARDIAC) 20 MG/ML IV SOLN
INTRAVENOUS | Status: DC | PRN
  Administered 2014-03-27: 50 mg via INTRAVENOUS

## 2014-03-27 MED ORDER — CEFAZOLIN SODIUM-DEXTROSE 2-3 GM-% IV SOLR
2.0000 g | INTRAVENOUS | Status: AC
  Administered 2014-03-27: 2 g via INTRAVENOUS

## 2014-03-27 MED ORDER — BUPIVACAINE-EPINEPHRINE (PF) 0.5% -1:200000 IJ SOLN
INTRAMUSCULAR | Status: DC | PRN
  Administered 2014-03-27: 25 mL via PERINEURAL

## 2014-03-27 MED ORDER — PROPOFOL 10 MG/ML IV BOLUS
INTRAVENOUS | Status: AC
  Filled 2014-03-27: qty 20

## 2014-03-27 MED ORDER — FENTANYL CITRATE 0.05 MG/ML IJ SOLN
INTRAMUSCULAR | Status: AC
  Filled 2014-03-27: qty 2

## 2014-03-27 MED ORDER — SODIUM CHLORIDE 0.9 % IV SOLN
INTRAVENOUS | Status: DC | PRN
  Administered 2014-03-27: 1000 mL

## 2014-03-27 MED ORDER — FENTANYL CITRATE 0.05 MG/ML IJ SOLN
50.0000 ug | INTRAMUSCULAR | Status: DC | PRN
  Administered 2014-03-27: 100 ug via INTRAVENOUS

## 2014-03-27 MED ORDER — PHENYLEPHRINE HCL 10 MG/ML IJ SOLN
INTRAMUSCULAR | Status: DC | PRN
  Administered 2014-03-27: 40 ug via INTRAVENOUS

## 2014-03-27 SURGICAL SUPPLY — 84 items
APL SKNCLS STERI-STRIP NONHPOA (GAUZE/BANDAGES/DRESSINGS)
BENZOIN TINCTURE PRP APPL 2/3 (GAUZE/BANDAGES/DRESSINGS) IMPLANT
BLADE CLIPPER SURG (BLADE) IMPLANT
BLADE SURG 15 STRL LF DISP TIS (BLADE) IMPLANT
BLADE SURG 15 STRL SS (BLADE)
BUR OVAL 4.0 (BURR) ×3 IMPLANT
CANISTER SUCT 3000ML (MISCELLANEOUS) IMPLANT
CANNULA 5.75X71 LONG (CANNULA) ×3 IMPLANT
CANNULA TWIST IN 8.25X7CM (CANNULA) IMPLANT
CHLORAPREP W/TINT 26ML (MISCELLANEOUS) ×3 IMPLANT
CLOSURE WOUND 1/2 X4 (GAUZE/BANDAGES/DRESSINGS)
DECANTER SPIKE VIAL GLASS SM (MISCELLANEOUS) IMPLANT
DRAPE INCISE IOBAN 66X45 STRL (DRAPES) ×3 IMPLANT
DRAPE STERI 35X30 U-POUCH (DRAPES) ×3 IMPLANT
DRAPE SURG 17X23 STRL (DRAPES) ×3 IMPLANT
DRAPE U 20/CS (DRAPES) ×3 IMPLANT
DRAPE U-SHAPE 47X51 STRL (DRAPES) ×3 IMPLANT
DRAPE U-SHAPE 76X120 STRL (DRAPES) ×6 IMPLANT
DRSG PAD ABDOMINAL 8X10 ST (GAUZE/BANDAGES/DRESSINGS) ×3 IMPLANT
ELECT REM PT RETURN 9FT ADLT (ELECTROSURGICAL)
ELECTRODE REM PT RTRN 9FT ADLT (ELECTROSURGICAL) ×1 IMPLANT
GAUZE SPONGE 4X4 12PLY STRL (GAUZE/BANDAGES/DRESSINGS) ×3 IMPLANT
GAUZE SPONGE 4X4 16PLY XRAY LF (GAUZE/BANDAGES/DRESSINGS) IMPLANT
GAUZE XEROFORM 1X8 LF (GAUZE/BANDAGES/DRESSINGS) ×3 IMPLANT
GLOVE BIO SURGEON STRL SZ7 (GLOVE) ×3 IMPLANT
GLOVE BIO SURGEON STRL SZ7.5 (GLOVE) ×4 IMPLANT
GLOVE BIOGEL PI IND STRL 7.0 (GLOVE) ×1 IMPLANT
GLOVE BIOGEL PI IND STRL 8 (GLOVE) ×2 IMPLANT
GLOVE BIOGEL PI INDICATOR 7.0 (GLOVE) ×6
GLOVE BIOGEL PI INDICATOR 8 (GLOVE) ×2
GLOVE ECLIPSE 6.5 STRL STRAW (GLOVE) ×2 IMPLANT
GOWN STRL REUS W/ TWL LRG LVL3 (GOWN DISPOSABLE) ×2 IMPLANT
GOWN STRL REUS W/TWL LRG LVL3 (GOWN DISPOSABLE) ×6
GOWN STRL REUS W/TWL XL LVL4 (GOWN DISPOSABLE) ×3 IMPLANT
IV NS IRRIG 3000ML ARTHROMATIC (IV SOLUTION) ×4 IMPLANT
LASSO CRESCENT QUICKPASS (SUTURE) IMPLANT
LIQUID BAND (GAUZE/BANDAGES/DRESSINGS) IMPLANT
MANIFOLD NEPTUNE II (INSTRUMENTS) ×3 IMPLANT
NDL 1/2 CIR CATGUT .05X1.09 (NEEDLE) IMPLANT
NDL SCORPION MULTI FIRE (NEEDLE) IMPLANT
NDL SUT 6 .5 CRC .975X.05 MAYO (NEEDLE) IMPLANT
NEEDLE 1/2 CIR CATGUT .05X1.09 (NEEDLE) IMPLANT
NEEDLE MAYO TAPER (NEEDLE)
NEEDLE SCORPION MULTI FIRE (NEEDLE) IMPLANT
NS IRRIG 1000ML POUR BTL (IV SOLUTION) IMPLANT
PACK ARTHROSCOPY DSU (CUSTOM PROCEDURE TRAY) ×3 IMPLANT
PACK BASIN DAY SURGERY FS (CUSTOM PROCEDURE TRAY) ×3 IMPLANT
PENCIL BUTTON HOLSTER BLD 10FT (ELECTRODE) IMPLANT
RESECTOR FULL RADIUS 4.2MM (BLADE) ×3 IMPLANT
SHEET MEDIUM DRAPE 40X70 STRL (DRAPES) IMPLANT
SLEEVE SCD COMPRESS KNEE MED (MISCELLANEOUS) ×3 IMPLANT
SLING ARM IMMOBILIZER MED (SOFTGOODS) IMPLANT
SLING ARM LRG ADULT FOAM STRAP (SOFTGOODS) ×2 IMPLANT
SLING ARM MED ADULT FOAM STRAP (SOFTGOODS) IMPLANT
SLING ARM XL FOAM STRAP (SOFTGOODS) IMPLANT
SPONGE LAP 4X18 X RAY DECT (DISPOSABLE) IMPLANT
STRIP CLOSURE SKIN 1/2X4 (GAUZE/BANDAGES/DRESSINGS) IMPLANT
SUCTION FRAZIER TIP 10 FR DISP (SUCTIONS) IMPLANT
SUPPORT WRAP ARM LG (MISCELLANEOUS) IMPLANT
SUT BONE WAX W31G (SUTURE) IMPLANT
SUT ETHIBOND 2 OS 4 DA (SUTURE) IMPLANT
SUT ETHILON 3 0 PS 1 (SUTURE) ×3 IMPLANT
SUT ETHILON 4 0 PS 2 18 (SUTURE) IMPLANT
SUT FIBERWIRE #2 38 T-5 BLUE (SUTURE)
SUT MNCRL AB 3-0 PS2 18 (SUTURE) IMPLANT
SUT MNCRL AB 4-0 PS2 18 (SUTURE) IMPLANT
SUT PDS AB 0 CT 36 (SUTURE) IMPLANT
SUT PROLENE 3 0 PS 2 (SUTURE) IMPLANT
SUT TIGER TAPE 7 IN WHITE (SUTURE) IMPLANT
SUT VIC AB 0 CT1 27 (SUTURE)
SUT VIC AB 0 CT1 27XBRD ANBCTR (SUTURE) IMPLANT
SUT VIC AB 2-0 SH 27 (SUTURE)
SUT VIC AB 2-0 SH 27XBRD (SUTURE) IMPLANT
SUTURE FIBERWR #2 38 T-5 BLUE (SUTURE) IMPLANT
SYR BULB 3OZ (MISCELLANEOUS) IMPLANT
TAPE FIBER 2MM 7IN #2 BLUE (SUTURE) IMPLANT
TOWEL OR 17X24 6PK STRL BLUE (TOWEL DISPOSABLE) ×3 IMPLANT
TOWEL OR NON WOVEN STRL DISP B (DISPOSABLE) ×3 IMPLANT
TUBE CONNECTING 20'X1/4 (TUBING) ×1
TUBE CONNECTING 20X1/4 (TUBING) ×2 IMPLANT
TUBING ARTHROSCOPY IRRIG 16FT (MISCELLANEOUS) ×3 IMPLANT
WAND STAR VAC 90 (SURGICAL WAND) ×3 IMPLANT
WATER STERILE IRR 1000ML POUR (IV SOLUTION) ×3 IMPLANT
YANKAUER SUCT BULB TIP NO VENT (SUCTIONS) IMPLANT

## 2014-03-27 NOTE — Discharge Instructions (Signed)
Discharge Instructions after Arthroscopic Shoulder Surgery ° ° °A sling has been provided for you. You may remove the sling after 72 hours. The sling may be worn for your protection, if you are in a crowd.  °Use ice on the shoulder intermittently over the first 48 hours after surgery.  °Pain medication has been prescribed for you.  °Use your medication liberally over the first 48 hours, and then begin to taper your use. You may take Extra Strength Tylenol or Tylenol only in place of the pain pills. DO NOT take ANY nonsteroidal anti-inflammatory pain medications: Advil, Motrin, Ibuprofen, Aleve, Naproxen, or Naprosyn.  °You may remove your dressing after two days.  °You may shower 5 days after surgery. The incision CANNOT get wet prior to 5 days. Simply allow the water to wash over the site and then pat dry. Do not rub the incision. Make sure your axilla (armpit) is completely dry after showering.  °Take one aspirin a day for 2 weeks after surgery, unless you have an aspirin sensitivity/allergy or asthma.  °Three to 5 times each day you should perform assisted overhead reaching and external rotation (outward turning) exercises with the operative arm. Both exercises should be done with the non-operative arm used as the "therapist arm" while the operative arm remains relaxed. Ten of each exercise should be done three to five times each day. ° ° ° °Overhead reach is helping to lift your stiff arm up as high as it will go. To stretch your overhead reach, lie flat on your back, relax, and grasp the wrist of the tight shoulder with your opposite hand. Using the power in your opposite arm, bring the stiff arm up as far as it is comfortable. Start holding it for ten seconds and then work up to where you can hold it for a count of 30. Breathe slowly and deeply while the arm is moved. Repeat this stretch ten times, trying to help the arm up a little higher each time.  ° ° ° ° ° °External rotation is turning the arm out to  the side while your elbow stays close to your body. External rotation is best stretched while you are lying on your back. Hold a cane, yardstick, broom handle, or dowel in both hands. Bend both elbows to a right angle. Use steady, gentle force from your normal arm to rotate the hand of the stiff shoulder out away from your body. Continue the rotation as far as it will go comfortably, holding it there for a count of 10. Repeat this exercise ten times.  ° ° ° °Please call [phone] during normal business hours or [phone] after hours for any problems. Including the following: ° °- excessive redness of the incisions °- drainage for more than 4 days °- fever of more than 101.5 F ° °*Please note that pain medications will not be refilled after hours or on weekends. ° ° ° °Post Anesthesia Home Care Instructions ° °Activity: °Get plenty of rest for the remainder of the day. A responsible adult should stay with you for 24 hours following the procedure.  °For the next 24 hours, DO NOT: °-Drive a car °-Operate machinery °-Drink alcoholic beverages °-Take any medication unless instructed by your physician °-Make any legal decisions or sign important papers. ° °Meals: °Start with liquid foods such as gelatin or soup. Progress to regular foods as tolerated. Avoid greasy, spicy, heavy foods. If nausea and/or vomiting occur, drink only clear liquids until the nausea and/or vomiting subsides. Call   your physician if vomiting continues.  Special Instructions/Symptoms: Your throat may feel dry or sore from the anesthesia or the breathing tube placed in your throat during surgery. If this causes discomfort, gargle with warm salt water. The discomfort should disappear within 24 hours.  Regional Anesthesia Blocks  1. Numbness or the inability to move the "blocked" extremity may last from 3-48 hours after placement. The length of time depends on the medication injected and your individual response to the medication. If the  numbness is not going away after 48 hours, call your surgeon.  2. The extremity that is blocked will need to be protected until the numbness is gone and the  Strength has returned. Because you cannot feel it, you will need to take extra care to avoid injury. Because it may be weak, you may have difficulty moving it or using it. You may not know what position it is in without looking at it while the block is in effect.  3. For blocks in the legs and feet, returning to weight bearing and walking needs to be done carefully. You will need to wait until the numbness is entirely gone and the strength has returned. You should be able to move your leg and foot normally before you try and bear weight or walk. You will need someone to be with you when you first try to ensure you do not fall and possibly risk injury.  4. Bruising and tenderness at the needle site are common side effects and will resolve in a few days.  5. Persistent numbness or new problems with movement should be communicated to the surgeon or the [place] [phone] [place] [phone]).

## 2014-03-27 NOTE — Transfer of Care (Signed)
Immediate Anesthesia Transfer of Care Note  Patient: George Mckay  Procedure(s) Performed: Procedure(s) with comments: SHOULDER ARTHROSCOPY WITH SUBACROMIAL DECOMPRESSION (Left) - Left shoulder arthroscopy rotator cuff debridement, subacromial decompression, distal clavical excision RESECTION DISTAL CLAVICAL (Left)  Patient Location: PACU  Anesthesia Type:General and Regional  Level of Consciousness: awake, alert  and oriented  Airway & Oxygen Therapy: Patient Spontanous Breathing and Patient connected to face mask oxygen  Post-op Assessment: Report given to PACU RN and Post -op Vital signs reviewed and stable  Post vital signs: Reviewed and stable  Complications: No apparent anesthesia complications

## 2014-03-27 NOTE — Op Note (Signed)
Procedure(s):   George Mckay male 69 y.o. 03/27/2014  Procedure(s) and Anesthesia Type:  #1 left shoulder arthroscopic debridement partial thickness rotator cuff tear, extensive bursitis and limited capsular release #2 left shoulder arthroscopic subacromial decompression #3 left shoulder arthroscopic distal clavicle excision  Surgeon(s) and Role:    * Nita Sells, MD - Primary     Surgeon: Nita Sells   Assistants: Jeanmarie Hubert PA-C (Danielle was present and scrubbed throughout the procedure and was essential in positioning, assisting with the camera and instrumentation,, and closure)  Anesthesia: General endotracheal anesthesia with preoperative interscalene block given by the attending anesthesiologis    Procedure Detail  SHOULDER ARTHROSCOPY WITH SUBACROMIAL DECOMPRESSION, RESECTION DISTAL CLAVICAL  Estimated Blood Loss: Min         Drains: none  Blood Given: none         Specimens: none        Complications:  * No complications entered in OR log *         Disposition: PACU - hemodynamically stable.         Condition: stable    Procedure:   INDICATIONS FOR SURGERY: The patient is 69 y.o. male who has had a long history of left shoulder pain which is failed conservative management with injection therapy exercises rest and anti-inflammatories. He had an MRI which showed some partial thickness tearing of the rotator cuff and significant bone spurring of the distal clavicle and anterior acromion. Ultimately he was indicated for surgical treatment to try and decrease pain and restore function. He understood risks benefits alternatives to procedure wished to go forward with surgery.  OPERATIVE FINDINGS: Examination under anesthesia: He was noted to have moderate stiffness with 150 forward flexion, abduction to about 80 and abducted external rotation to about 40, abducted internal rotation to 30. External rotation side was about  45.   DESCRIPTION OF PROCEDURE: The patient was identified in preoperative  holding area where I personally marked the operative site after  verifying site, side, and procedure with the patient. An interscalene block was given by the attending anesthesiologist the holding area.  The patient was taken back to the operating room where general anesthesia was induced without complication and was placed in the beach-chair position with the back  elevated about 60 degrees and all extremities and head and neck carefully padded and  positioned.   The left upper extremity was then prepped and  draped in a standard sterile fashion. The appropriate time-out  procedure was carried out. The patient did receive IV antibiotics  within 30 minutes of incision.   A small posterior portal incision was made and the arthroscope was introduced into the joint. An anterior portal was then established above the subscapularis using needle localization. Small cannula was placed anteriorly. Diagnostic arthroscopy was then carried out. Shoulder was noted to be fairly tight. He had some mild fraying of anterior and superior labrum which was debrided lightly. The subscapularis was intact. There was synovitis in the rotator interval. Glenoid humeral joint surfaces were intact without significant chondromalacia. The rotator cuff from the undersurface was intact. Biceps tendon was pulled into the joint and head fairly significant tenosynovitis but no partial or full tearing. Rotator interval release was carried out with the ArthriCare coming back to the posterior aspect of the conjoined tendon. This tissue is fairly thickened. Limited anterior capsular release was then carried out down to about the 5:00 position under direct visualization from outside in using the ArthriCare.  The  arthroscope was then introduced into the subacromial space a standard lateral portal was established with needle localization. The shaver was used  through the lateral portal to perform extensive bursectomy. 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 He did have a fair amount of hypertrophic bursitis as well. After the bursectomy the bursal surface of the rotator cuff was carefully examined and found to be largely intact with small. Partial thickness fraying anteriorly in the region of impingement but no exposed tuberosity. After this was debrided with a shaver remaining tendon was intact and healthy-appearing without significant thinning.  The coracoacromial ligament was taken down off the anterior acromion with the ArthroCare exposing a very large hooked anterior acromial spur. A high-speed bur was then used through the lateral portal to take down the anterior acromial spur from lateral to medial in a standard acromioplasty.  The acromioplasty was also viewed from the lateral portal and the bur was used as necessary to ensure that the acromion was completely flat from posterior to anterior.  The distal clavicle was exposed arthroscopically and the bur was used to take off the undersurface for approximately 8 mm from the lateral portal. The bur was then moved to an anterior portal position to complete the distal clavicle excision resecting about 8 mm of the distal clavicle and a smooth even fashion. I did use accessory the visor portal to gain access to the superior aspect of the clavicle given his significant superior spurring which could then be addressed while viewing from the anterior portal. This was viewed from anterior and lateral portals and felt to be complete.  The arthroscopic equipment was removed from the joint and the portals were closed with 3-0 nylon in an interrupted fashion. Sterile dressings were then applied including Xeroform 4 x 4's ABDs and tape. The patient was then allowed to awaken from general anesthesia, placed in a sling, transferred to the stretcher and taken to the  recovery room in stable condition.   POSTOPERATIVE PLAN: The patient will be discharged home today and will followup in one week for suture removal and wound check.  We will get him into physical therapy right away given his stiffness. If he has persistent anterior pain despite his rehabilitation from the surgery we may need to address further treatment towards his biceps which was noted to be synovitic but not torn. This should recover as he rehabilitates his shoulder.

## 2014-03-27 NOTE — Anesthesia Preprocedure Evaluation (Addendum)
Anesthesia Evaluation  Patient identified by MRN, date of birth, ID band Patient awake    Reviewed: Allergy & Precautions, NPO status , Patient's Chart, lab work & pertinent test results  Airway Mallampati: I  TM Distance: >3 FB Neck ROM: Full    Dental  (+) Teeth Intact, Dental Advisory Given   Pulmonary  breath sounds clear to auscultation        Cardiovascular hypertension, Rhythm:Regular Rate:Normal     Neuro/Psych    GI/Hepatic   Endo/Other    Renal/GU      Musculoskeletal   Abdominal   Peds  Hematology   Anesthesia Other Findings   Reproductive/Obstetrics                            Anesthesia Physical Anesthesia Plan  ASA: II  Anesthesia Plan: General   Post-op Pain Management:    Induction: Intravenous  Airway Management Planned: Oral ETT  Additional Equipment:   Intra-op Plan:   Post-operative Plan: Extubation in OR  Informed Consent: I have reviewed the patients History and Physical, chart, labs and discussed the procedure including the risks, benefits and alternatives for the proposed anesthesia with the patient or authorized representative who has indicated his/her understanding and acceptance.   Dental advisory given  Plan Discussed with: CRNA, Anesthesiologist and Surgeon  Anesthesia Plan Comments:         Anesthesia Quick Evaluation

## 2014-03-27 NOTE — Anesthesia Postprocedure Evaluation (Signed)
  Anesthesia Post-op Note  Patient: George Mckay  Procedure(s) Performed: Procedure(s) with comments: SHOULDER ARTHROSCOPY WITH SUBACROMIAL DECOMPRESSION (Left) - Left shoulder arthroscopy rotator cuff debridement, subacromial decompression, distal clavical excision RESECTION DISTAL CLAVICAL (Left)  Patient Location: PACU  Anesthesia Type: General with regional   Level of Consciousness: awake, alert  and oriented  Airway and Oxygen Therapy: Patient Spontanous Breathing  Post-op Pain: mild  Post-op Assessment: Post-op Vital signs reviewed  Post-op Vital Signs: Reviewed  Last Vitals:  Filed Vitals:   03/27/14 1500  BP: 124/69  Pulse: 87  Temp:   Resp: 12    Complications: No apparent anesthesia complications

## 2014-03-27 NOTE — H&P (Signed)
George Mckay is an 69 y.o. male.   Chief Complaint: L shoulder pain  HPI: L shoulder pain with impingement, AC arthritis and unfavorable acromial anatomy which has failed conservative management.  Past Medical History  Diagnosis Date  . Hyperlipidemia   . White coat syndrome with high blood pressure without hypertension   . Mild anemia   . Tear of left rotator cuff     Past Surgical History  Procedure Laterality Date  . Cardiac catheterization    . Colonoscopy      Family History  Problem Relation Age of Onset  . Cancer Mother   . Cancer Sister    Social History:  reports that he has never smoked. He does not have any smokeless tobacco history on file. He reports that he does not drink alcohol or use illicit drugs.  Allergies: No Known Allergies  Medications Prior to Admission  Medication Sig Dispense Refill  . aspirin EC 81 MG tablet Take 81 mg by mouth daily.    . Omega-3 Fatty Acids (FISH OIL TRIPLE STRENGTH) 1400 MG CAPS Take 1 capsule by mouth daily.      No results found for this or any previous visit (from the past 48 hour(s)). No results found.  Review of Systems  All other systems reviewed and are negative.   Blood pressure 136/80, pulse 72, temperature 97.6 F (36.4 C), temperature source Oral, resp. rate 20, height 5\' 6"  (1.676 m), weight 63.957 kg (141 lb), SpO2 100 %. Physical Exam  Constitutional: He is oriented to person, place, and time. He appears well-developed and well-nourished.  HENT:  Head: Atraumatic.  Eyes: EOM are normal.  Cardiovascular: Intact distal pulses.   Respiratory: Effort normal.  Musculoskeletal:  L shoulder pain with impingement testing, TTP at [facility] joint.  Neurological: He is alert and oriented to person, place, and time.  Skin: Skin is warm and dry.  Psychiatric: He has a normal mood and affect.     Assessment/Plan L shoulder pain with impingement, AC arthritis and unfavorable acromial anatomy which has failed conservative  management. Plan L arthr SAD, DCE, RC debridement versus repair. Risks / benefits of surgery discussed Consent on chart  NPO for OR Preop antibiotics   Azya Barbero WILLIAM 03/27/2014, 12:03 PM

## 2014-03-27 NOTE — Progress Notes (Signed)
Assisted Dr. Crews with left, ultrasound guided, interscalene  block. Side rails up, monitors on throughout procedure. See vital signs in flow sheet. Tolerated Procedure well. 

## 2014-03-27 NOTE — Anesthesia Procedure Notes (Addendum)
Anesthesia Regional Block:  Interscalene brachial plexus block  Pre-Anesthetic Checklist: ,, timeout performed, Correct Patient, Correct Site, Correct Laterality, Correct Procedure, Correct Position, site marked, Risks and benefits discussed,  Surgical consent,  Pre-op evaluation,  At surgeon's request and post-op pain management  Laterality: Left and Upper  Prep: chloraprep       Needles:  Injection technique: Single-shot  Needle Type: Echogenic Needle     Needle Length: 5cm 5 cm Needle Gauge: 21 and 21 G    Additional Needles:  Procedures: ultrasound guided (picture in chart) Interscalene brachial plexus block Narrative:  Start time: 03/27/2014 12:22 PM End time: 03/27/2014 12:27 PM Injection made incrementally with aspirations every 5 mL.  Performed by: Personally  Anesthesiologist: CREWS, DAVID A   Procedure Name: Intubation Date/Time: 03/27/2014 12:42 PM Performed by: Melynda Ripple D Pre-anesthesia Checklist: Patient identified, Emergency Drugs available, Suction available and Patient being monitored Patient Re-evaluated:Patient Re-evaluated prior to inductionOxygen Delivery Method: Circle System Utilized Preoxygenation: Pre-oxygenation with 100% oxygen Intubation Type: IV induction Ventilation: Mask ventilation without difficulty Laryngoscope Size: Mac and 3 Grade View: Grade I Tube type: Oral Tube size: 8.0 mm Number of attempts: 1 Airway Equipment and Method: Stylet and Oral airway Placement Confirmation: ETT inserted through vocal cords under direct vision,  positive ETCO2 and breath sounds checked- equal and bilateral Secured at: 23 cm Tube secured with: Tape Dental Injury: Teeth and Oropharynx as per pre-operative assessment

## 2014-03-28 ENCOUNTER — Encounter (HOSPITAL_BASED_OUTPATIENT_CLINIC_OR_DEPARTMENT_OTHER): Payer: Self-pay | Admitting: Orthopedic Surgery

## 2014-04-03 DIAGNOSIS — M67912 Unspecified disorder of synovium and tendon, left shoulder: Secondary | ICD-10-CM | POA: Diagnosis not present

## 2014-04-03 DIAGNOSIS — M25612 Stiffness of left shoulder, not elsewhere classified: Secondary | ICD-10-CM | POA: Diagnosis not present

## 2014-04-03 DIAGNOSIS — M25512 Pain in left shoulder: Secondary | ICD-10-CM | POA: Diagnosis not present

## 2014-04-06 DIAGNOSIS — M25512 Pain in left shoulder: Secondary | ICD-10-CM | POA: Diagnosis not present

## 2014-04-06 DIAGNOSIS — M25612 Stiffness of left shoulder, not elsewhere classified: Secondary | ICD-10-CM | POA: Diagnosis not present

## 2014-04-07 DIAGNOSIS — M5416 Radiculopathy, lumbar region: Secondary | ICD-10-CM | POA: Diagnosis not present

## 2014-04-07 DIAGNOSIS — M5030 Other cervical disc degeneration, unspecified cervical region: Secondary | ICD-10-CM | POA: Diagnosis not present

## 2014-04-10 DIAGNOSIS — M542 Cervicalgia: Secondary | ICD-10-CM | POA: Diagnosis not present

## 2014-04-10 DIAGNOSIS — M545 Low back pain: Secondary | ICD-10-CM | POA: Diagnosis not present

## 2014-04-12 DIAGNOSIS — M9900 Segmental and somatic dysfunction of head region: Secondary | ICD-10-CM | POA: Diagnosis not present

## 2014-04-12 DIAGNOSIS — M5022 Other cervical disc displacement, mid-cervical region: Secondary | ICD-10-CM | POA: Diagnosis not present

## 2014-04-12 DIAGNOSIS — M545 Low back pain: Secondary | ICD-10-CM | POA: Diagnosis not present

## 2014-04-12 DIAGNOSIS — M542 Cervicalgia: Secondary | ICD-10-CM | POA: Diagnosis not present

## 2014-04-12 DIAGNOSIS — M9901 Segmental and somatic dysfunction of cervical region: Secondary | ICD-10-CM | POA: Diagnosis not present

## 2014-04-17 DIAGNOSIS — M545 Low back pain: Secondary | ICD-10-CM | POA: Diagnosis not present

## 2014-04-17 DIAGNOSIS — M542 Cervicalgia: Secondary | ICD-10-CM | POA: Diagnosis not present

## 2014-04-18 DIAGNOSIS — M9900 Segmental and somatic dysfunction of head region: Secondary | ICD-10-CM | POA: Diagnosis not present

## 2014-04-18 DIAGNOSIS — M9901 Segmental and somatic dysfunction of cervical region: Secondary | ICD-10-CM | POA: Diagnosis not present

## 2014-04-18 DIAGNOSIS — M5022 Other cervical disc displacement, mid-cervical region: Secondary | ICD-10-CM | POA: Diagnosis not present

## 2014-04-19 DIAGNOSIS — M25612 Stiffness of left shoulder, not elsewhere classified: Secondary | ICD-10-CM | POA: Diagnosis not present

## 2014-04-19 DIAGNOSIS — M25512 Pain in left shoulder: Secondary | ICD-10-CM | POA: Diagnosis not present

## 2014-04-20 DIAGNOSIS — M9901 Segmental and somatic dysfunction of cervical region: Secondary | ICD-10-CM | POA: Diagnosis not present

## 2014-04-20 DIAGNOSIS — M9900 Segmental and somatic dysfunction of head region: Secondary | ICD-10-CM | POA: Diagnosis not present

## 2014-04-20 DIAGNOSIS — M25512 Pain in left shoulder: Secondary | ICD-10-CM | POA: Diagnosis not present

## 2014-04-20 DIAGNOSIS — M5022 Other cervical disc displacement, mid-cervical region: Secondary | ICD-10-CM | POA: Diagnosis not present

## 2014-04-20 DIAGNOSIS — M25612 Stiffness of left shoulder, not elsewhere classified: Secondary | ICD-10-CM | POA: Diagnosis not present

## 2014-04-24 DIAGNOSIS — M25612 Stiffness of left shoulder, not elsewhere classified: Secondary | ICD-10-CM | POA: Diagnosis not present

## 2014-04-24 DIAGNOSIS — M25512 Pain in left shoulder: Secondary | ICD-10-CM | POA: Diagnosis not present

## 2014-04-26 DIAGNOSIS — M25512 Pain in left shoulder: Secondary | ICD-10-CM | POA: Diagnosis not present

## 2014-04-26 DIAGNOSIS — M25612 Stiffness of left shoulder, not elsewhere classified: Secondary | ICD-10-CM | POA: Diagnosis not present

## 2014-04-27 DIAGNOSIS — M25512 Pain in left shoulder: Secondary | ICD-10-CM | POA: Diagnosis not present

## 2014-04-27 DIAGNOSIS — M25612 Stiffness of left shoulder, not elsewhere classified: Secondary | ICD-10-CM | POA: Diagnosis not present

## 2014-05-01 DIAGNOSIS — M25612 Stiffness of left shoulder, not elsewhere classified: Secondary | ICD-10-CM | POA: Diagnosis not present

## 2014-05-01 DIAGNOSIS — M25512 Pain in left shoulder: Secondary | ICD-10-CM | POA: Diagnosis not present

## 2014-05-03 DIAGNOSIS — L719 Rosacea, unspecified: Secondary | ICD-10-CM | POA: Diagnosis not present

## 2014-05-03 DIAGNOSIS — L738 Other specified follicular disorders: Secondary | ICD-10-CM | POA: Diagnosis not present

## 2014-05-04 DIAGNOSIS — M25612 Stiffness of left shoulder, not elsewhere classified: Secondary | ICD-10-CM | POA: Diagnosis not present

## 2014-05-04 DIAGNOSIS — M25512 Pain in left shoulder: Secondary | ICD-10-CM | POA: Diagnosis not present

## 2014-05-05 DIAGNOSIS — M25512 Pain in left shoulder: Secondary | ICD-10-CM | POA: Diagnosis not present

## 2014-05-11 DIAGNOSIS — M25512 Pain in left shoulder: Secondary | ICD-10-CM | POA: Diagnosis not present

## 2014-05-11 DIAGNOSIS — M25612 Stiffness of left shoulder, not elsewhere classified: Secondary | ICD-10-CM | POA: Diagnosis not present

## 2014-05-12 DIAGNOSIS — M25512 Pain in left shoulder: Secondary | ICD-10-CM | POA: Diagnosis not present

## 2014-05-12 DIAGNOSIS — M25612 Stiffness of left shoulder, not elsewhere classified: Secondary | ICD-10-CM | POA: Diagnosis not present

## 2014-05-16 DIAGNOSIS — M25612 Stiffness of left shoulder, not elsewhere classified: Secondary | ICD-10-CM | POA: Diagnosis not present

## 2014-05-16 DIAGNOSIS — M25512 Pain in left shoulder: Secondary | ICD-10-CM | POA: Diagnosis not present

## 2014-05-18 DIAGNOSIS — M25512 Pain in left shoulder: Secondary | ICD-10-CM | POA: Diagnosis not present

## 2014-05-18 DIAGNOSIS — M25612 Stiffness of left shoulder, not elsewhere classified: Secondary | ICD-10-CM | POA: Diagnosis not present

## 2014-05-22 DIAGNOSIS — M25612 Stiffness of left shoulder, not elsewhere classified: Secondary | ICD-10-CM | POA: Diagnosis not present

## 2014-05-22 DIAGNOSIS — M25512 Pain in left shoulder: Secondary | ICD-10-CM | POA: Diagnosis not present

## 2014-05-25 DIAGNOSIS — M25512 Pain in left shoulder: Secondary | ICD-10-CM | POA: Diagnosis not present

## 2014-05-25 DIAGNOSIS — M25612 Stiffness of left shoulder, not elsewhere classified: Secondary | ICD-10-CM | POA: Diagnosis not present

## 2014-05-29 DIAGNOSIS — M25512 Pain in left shoulder: Secondary | ICD-10-CM | POA: Diagnosis not present

## 2014-05-29 DIAGNOSIS — M25612 Stiffness of left shoulder, not elsewhere classified: Secondary | ICD-10-CM | POA: Diagnosis not present

## 2014-06-01 DIAGNOSIS — M25612 Stiffness of left shoulder, not elsewhere classified: Secondary | ICD-10-CM | POA: Diagnosis not present

## 2014-06-01 DIAGNOSIS — M25512 Pain in left shoulder: Secondary | ICD-10-CM | POA: Diagnosis not present

## 2014-06-16 DIAGNOSIS — Z9889 Other specified postprocedural states: Secondary | ICD-10-CM | POA: Diagnosis not present

## 2014-09-21 DIAGNOSIS — L237 Allergic contact dermatitis due to plants, except food: Secondary | ICD-10-CM | POA: Diagnosis not present

## 2014-09-27 DIAGNOSIS — M67911 Unspecified disorder of synovium and tendon, right shoulder: Secondary | ICD-10-CM | POA: Diagnosis not present

## 2014-10-02 DIAGNOSIS — M5416 Radiculopathy, lumbar region: Secondary | ICD-10-CM | POA: Diagnosis not present

## 2014-10-06 DIAGNOSIS — M545 Low back pain: Secondary | ICD-10-CM | POA: Diagnosis not present

## 2014-10-16 DIAGNOSIS — R200 Anesthesia of skin: Secondary | ICD-10-CM | POA: Diagnosis not present

## 2015-01-08 DIAGNOSIS — M25511 Pain in right shoulder: Secondary | ICD-10-CM | POA: Diagnosis not present

## 2015-01-11 DIAGNOSIS — M25511 Pain in right shoulder: Secondary | ICD-10-CM | POA: Diagnosis not present

## 2015-01-16 DIAGNOSIS — M25511 Pain in right shoulder: Secondary | ICD-10-CM | POA: Diagnosis not present

## 2015-01-18 DIAGNOSIS — M25511 Pain in right shoulder: Secondary | ICD-10-CM | POA: Diagnosis not present

## 2015-01-23 DIAGNOSIS — M25511 Pain in right shoulder: Secondary | ICD-10-CM | POA: Diagnosis not present

## 2015-01-25 DIAGNOSIS — M25511 Pain in right shoulder: Secondary | ICD-10-CM | POA: Diagnosis not present

## 2015-01-30 DIAGNOSIS — M25511 Pain in right shoulder: Secondary | ICD-10-CM | POA: Diagnosis not present

## 2015-02-06 DIAGNOSIS — M25511 Pain in right shoulder: Secondary | ICD-10-CM | POA: Diagnosis not present

## 2015-02-08 DIAGNOSIS — M25511 Pain in right shoulder: Secondary | ICD-10-CM | POA: Diagnosis not present

## 2015-02-12 DIAGNOSIS — M25511 Pain in right shoulder: Secondary | ICD-10-CM | POA: Diagnosis not present

## 2015-02-19 DIAGNOSIS — M25511 Pain in right shoulder: Secondary | ICD-10-CM | POA: Diagnosis not present

## 2015-02-26 DIAGNOSIS — M25511 Pain in right shoulder: Secondary | ICD-10-CM | POA: Diagnosis not present

## 2015-03-08 DIAGNOSIS — M25511 Pain in right shoulder: Secondary | ICD-10-CM | POA: Diagnosis not present

## 2015-03-30 DIAGNOSIS — R7301 Impaired fasting glucose: Secondary | ICD-10-CM | POA: Diagnosis not present

## 2015-03-30 DIAGNOSIS — R202 Paresthesia of skin: Secondary | ICD-10-CM | POA: Diagnosis not present

## 2015-03-30 DIAGNOSIS — Z125 Encounter for screening for malignant neoplasm of prostate: Secondary | ICD-10-CM | POA: Diagnosis not present

## 2015-03-30 DIAGNOSIS — R8290 Unspecified abnormal findings in urine: Secondary | ICD-10-CM | POA: Diagnosis not present

## 2015-03-30 DIAGNOSIS — N289 Disorder of kidney and ureter, unspecified: Secondary | ICD-10-CM | POA: Diagnosis not present

## 2015-03-30 DIAGNOSIS — E785 Hyperlipidemia, unspecified: Secondary | ICD-10-CM | POA: Diagnosis not present

## 2015-03-30 DIAGNOSIS — Z0000 Encounter for general adult medical examination without abnormal findings: Secondary | ICD-10-CM | POA: Diagnosis not present

## 2015-04-09 ENCOUNTER — Encounter: Payer: Self-pay | Admitting: Neurology

## 2015-04-09 ENCOUNTER — Ambulatory Visit (INDEPENDENT_AMBULATORY_CARE_PROVIDER_SITE_OTHER): Payer: Medicare Other | Admitting: Neurology

## 2015-04-09 VITALS — BP 134/77 | HR 74 | Ht 66.0 in | Wt 148.8 lb

## 2015-04-09 DIAGNOSIS — M5416 Radiculopathy, lumbar region: Secondary | ICD-10-CM | POA: Diagnosis not present

## 2015-04-09 DIAGNOSIS — R200 Anesthesia of skin: Secondary | ICD-10-CM

## 2015-04-09 DIAGNOSIS — R5383 Other fatigue: Secondary | ICD-10-CM | POA: Diagnosis not present

## 2015-04-09 DIAGNOSIS — R208 Other disturbances of skin sensation: Secondary | ICD-10-CM

## 2015-04-09 DIAGNOSIS — R209 Unspecified disturbances of skin sensation: Secondary | ICD-10-CM | POA: Diagnosis not present

## 2015-04-09 DIAGNOSIS — R7302 Impaired glucose tolerance (oral): Secondary | ICD-10-CM | POA: Diagnosis not present

## 2015-04-09 DIAGNOSIS — E538 Deficiency of other specified B group vitamins: Secondary | ICD-10-CM

## 2015-04-09 DIAGNOSIS — G629 Polyneuropathy, unspecified: Secondary | ICD-10-CM

## 2015-04-09 DIAGNOSIS — E519 Thiamine deficiency, unspecified: Secondary | ICD-10-CM | POA: Diagnosis not present

## 2015-04-09 DIAGNOSIS — R635 Abnormal weight gain: Secondary | ICD-10-CM | POA: Diagnosis not present

## 2015-04-09 DIAGNOSIS — R7301 Impaired fasting glucose: Secondary | ICD-10-CM | POA: Diagnosis not present

## 2015-04-09 DIAGNOSIS — E531 Pyridoxine deficiency: Secondary | ICD-10-CM | POA: Diagnosis not present

## 2015-04-09 HISTORY — DX: Radiculopathy, lumbar region: M54.16

## 2015-04-09 NOTE — Patient Instructions (Signed)
Remember to drink plenty of fluid, eat healthy meals and do not skip any meals. Try to eat protein with a every meal and eat a healthy snack such as fruit or nuts in between meals. Try to keep a regular sleep-wake schedule and try to exercise daily, particularly in the form of walking, 20-30 minutes a day, if you can.   As far as diagnostic testing: labs, emg/ncs  I would like to see you back for emg/ncs, sooner if we need to. Please call us with any interim questions, concerns, problems, updates or refill requests.   Our phone number is [phone]. We also have an after hours call service for urgent matters and there is a physician on-call for urgent questions. For any emergencies you know to call 911 or go to the nearest emergency room

## 2015-04-09 NOTE — Progress Notes (Addendum)
[facility]    Provider:  Dr Jaynee Eagles Referring Provider: Hulan Fess, MD Primary Care Physician:  Gennette Pac, MD  CC:  Numbness in the right heel and toes.   HPI:  George Mckay is a 70 y.o. male here as a referral from Dr. Rex Kras for numbness and pain in the right foot. PMHx of elevated fasting glucose, HLD, abnormal kidney fx with gfr 56, lumbar radiculopathy. Started about 6-7 months ago. No inciting factors. Hurts after he gets up and it radiates up the leg, the lateral side up to the hip. Worsening, used to be in the heel and now extends to the toes. He had a neuroma in the ball of his foot. He has sciatica and thought it was part of that but Dr. Jacelyn Grip said it isn't related to his low back. No cramping in the feet. Left leg and foot is unaffected. He has sciatica that starts in the low back and radiates to the buttocks, pain in the foot is different. No Hx of diabetes, hasn't drank in 17 years, no exposure to toxins or medications that can cause neuropathy. In the morning he has some decreased balance but it improves after he gets up.  Numbness of the heel is is all day long. No burning or tingling. If he sits too long it will start to hurt and radiate up to the hip again which lasts less than 5 minutes and is a 5/10 in pain. Walking around helps it. Worse when sitting for a long time. Feet do not feel cold. No weakness. No change in bowel or bladder.   Reviewed notes, labs and imaging from outside physicians, which showed:   CBC w diff, cmp unremarkable except creatinine 1.25 and glucose 135. ldl 141.   Review of Systems: Patient complains of symptoms per HPI as well as the following symptoms: hearing loss, ringing in ears, rash. Pertinent negatives per HPI. All others negative.   Social History   Social History  . Marital Status: Married    Spouse Name: Shirlee Limerick  . Number of Children: 3  . Years of Education: 16   Occupational History  . Retired    Social  History Main Topics  . Smoking status: Never Smoker   . Smokeless tobacco: Not on file  . Alcohol Use: No  . Drug Use: No  . Sexual Activity: Yes   Other Topics Concern  . Not on file   Social History Narrative   Lives with wife.    Caffeine use: Tea daily    Family History  Problem Relation Age of Onset  . Cancer Mother   . Cancer Sister   . Neuropathy Neg Hx     Past Medical History  Diagnosis Date  . Hyperlipidemia   . White coat syndrome with high blood pressure without hypertension   . Mild anemia   . Tear of left rotator cuff   . Chronic kidney disease   . Lumbar radiculopathy, chronic 04/09/2015    Past Surgical History  Procedure Laterality Date  . Cardiac catheterization  about 1999  . Colonoscopy    . Shoulder arthroscopy with subacromial decompression Left 03/27/2014    Procedure: SHOULDER ARTHROSCOPY WITH SUBACROMIAL DECOMPRESSION;  Surgeon: Nita Sells, MD;  Location: [facility];  Service: Orthopedics;  Laterality: Left;  Left shoulder arthroscopy rotator cuff debridement, subacromial decompression, distal clavical excision  . Resection distal clavical Left 03/27/2014    Procedure: RESECTION DISTAL CLAVICAL;  Surgeon: Nita Sells,  MD;  Location: [facility];  Service: Orthopedics;  Laterality: Left;    Current Outpatient Prescriptions  Medication Sig Dispense Refill  . aspirin EC 81 MG tablet Take 81 mg by mouth daily.    . metroNIDAZOLE (METROCREAM) 0.75 % cream Apply 1 application topically 2 (two) times daily.     No current facility-administered medications for this visit.    Allergies as of 04/09/2015  . (No Known Allergies)    Vitals: BP 134/77 mmHg  Pulse 74  Ht 5\' 6"  (1.676 m)  Wt 148 lb 12.8 oz (67.495 kg)  BMI 24.03 kg/m2 Last Weight:  Wt Readings from Last 1 Encounters:  04/09/15 148 lb 12.8 oz (67.495 kg)   Last Height:   Ht Readings from Last 1 Encounters:  04/09/15 5\' 6"   (1.676 m)    Physical exam: Exam: Gen: NAD                     CV: RRR, no MRG. No Carotid Bruits. No peripheral edema, warm, nontender Eyes: Conjunctivae clear without exudates or hemorrhage  Neuro: Detailed Neurologic Exam  Speech:    Speech is normal; fluent and spontaneous with normal comprehension.  Cognition:    The patient is oriented to person, place, and time;     recent and remote memory intact;     language fluent;     normal attention, concentration,     fund of knowledge Cranial Nerves:    The pupils are equal, round, and reactive to light. The fundi are normal and spontaneous venous pulsations are present. Visual fields are full to finger confrontation. Extraocular movements are intact. Trigeminal sensation is intact and the muscles of mastication are normal. The face is symmetric. The palate elevates in the midline. Hearing intact to voice. Voice is normal. Shoulder shrug is normal. The tongue has normal motion without fasciculations.   Coordination:    Normal finger to nose and heel to shin.  Gait:    Heel-toe and tandem gait are normal.   Motor Observation:    No asymmetry, no atrophy, and no involuntary movements noted. Tone:    Normal muscle tone.    Posture:    Posture is normal. normal erect    Strength:    Strength is V/V in the upper and lower limbs.      Sensation: intact to LT, pin prick, temp, vibration and proprioception distally in the feet. No decrease in sensation on the bottom of his feet on exam.     Reflex Exam:  DTR's: absent reflex right AJ.  Otherwise deep tendon reflexes in the upper and lower extremities are normal bilaterally.   Toes:    The toes are downgoing bilaterally.   Clonus:    Clonus is absent.    CC: Dr. Hulan Fess  Assessment/Plan:  70 year old male with numbness in the right heel, absent right ankle jerk and radiating pain from the hip areas to the foot. Appears to be radicular in the S1 distribution but he says  Dr. Mina Marble in the past has tol dhim it is not coming from his low back.   Emg/ncs of the bilateral legs including plantar nerves Will request mri l-spine and notes from [facility] Will perform a comprehensive serum neuropathy screen   MRI of the lumbar spine performed 10/06/2014: Impression mild subarticular and foraminal narrowing bilaterally bilaterally at L3-L4. Mild to moderate subarticular narrowing at L4-L5 is worse on the right. Mild foraminal narrowing bilaterally  at L4-L5. Mild subarticular narrowing bilaterally at L5-S1 is worse on the right. Facet hypertrophy contributes to mild right foraminal stenosis at L5-S1. Sarina Ill, MD  [facility] [address] [place], [place] [address]  Phone [phone] Fax [phone]

## 2015-04-11 ENCOUNTER — Telehealth: Payer: Self-pay | Admitting: *Deleted

## 2015-04-11 NOTE — Telephone Encounter (Signed)
Release faxed to [facility] requesting MRI lumber spine and office notes.

## 2015-04-12 ENCOUNTER — Telehealth: Payer: Self-pay | Admitting: *Deleted

## 2015-04-12 LAB — HEMOGLOBIN A1C
ESTIMATED AVERAGE GLUCOSE: 120 mg/dL
HEMOGLOBIN A1C: 5.8 % — AB (ref 4.8–5.6)

## 2015-04-12 LAB — B12 AND FOLATE PANEL
Folate: 14 ng/mL (ref >3.0)
Vitamin B-12: 369 pg/mL (ref 211–946)

## 2015-04-12 LAB — VITAMIN B1: Thiamine: 131.2 nmol/L (ref 66.5–200.0)

## 2015-04-12 LAB — RPR: RPR Ser Ql: NONREACTIVE

## 2015-04-12 LAB — HEAVY METALS, BLOOD
Arsenic: 7 ug/L (ref 2–23)
Lead, Blood: 2 ug/dL (ref 0–19)
Mercury: NOT DETECTED ug/L (ref 0.0–14.9)

## 2015-04-12 LAB — MULTIPLE MYELOMA PANEL, SERUM
ALBUMIN SERPL ELPH-MCNC: 4.1 g/dL (ref 2.9–4.4)
ALPHA2 GLOB SERPL ELPH-MCNC: 0.6 g/dL (ref 0.4–1.0)
Albumin/Glob SerPl: 1.5 (ref 0.7–1.7)
Alpha 1: 0.2 g/dL (ref 0.0–0.4)
B-GLOBULIN SERPL ELPH-MCNC: 1.1 g/dL (ref 0.7–1.3)
GAMMA GLOB SERPL ELPH-MCNC: 0.8 g/dL (ref 0.4–1.8)
GLOBULIN, TOTAL: 2.8 g/dL (ref 2.2–3.9)
IGG (IMMUNOGLOBIN G), SERUM: 732 mg/dL (ref 700–1600)
IgA/Immunoglobulin A, Serum: 273 mg/dL (ref 61–437)
IgM (Immunoglobulin M), Srm: 123 mg/dL (ref 20–172)
TOTAL PROTEIN: 6.9 g/dL (ref 6.0–8.5)

## 2015-04-12 LAB — METHYLMALONIC ACID, SERUM: Methylmalonic Acid: 202 nmol/L (ref 0–378)

## 2015-04-12 LAB — TSH: TSH: 2.95 u[IU]/mL (ref 0.450–4.500)

## 2015-04-12 LAB — B. BURGDORFI ANTIBODIES: Lyme IgG/IgM Ab: <0.91 {ISR} (ref 0.00–0.90)

## 2015-04-12 LAB — HIV ANTIBODY (ROUTINE TESTING W REFLEX): HIV SCREEN 4TH GENERATION: NONREACTIVE

## 2015-04-12 LAB — HEPATITIS C ANTIBODY: Hep C Virus Ab: <0.1 s/co ratio (ref 0.0–0.9)

## 2015-04-12 LAB — VITAMIN B6: Vitamin B6: 15.2 ug/L (ref 5.3–46.7)

## 2015-04-12 LAB — SEDIMENTATION RATE: Sed Rate: 2 mm/hr (ref 0–30)

## 2015-04-12 LAB — ANGIOTENSIN CONVERTING ENZYME: Angio Convert Enzyme: 67 U/L (ref 14–82)

## 2015-04-12 LAB — ANA W/REFLEX: Anti Nuclear Antibody(ANA): NEGATIVE

## 2015-04-12 LAB — RHEUMATOID FACTOR

## 2015-04-12 NOTE — Telephone Encounter (Signed)
Receive records from [facility] notes on [place].

## 2015-04-13 ENCOUNTER — Telehealth: Payer: Self-pay | Admitting: *Deleted

## 2015-04-13 NOTE — Telephone Encounter (Signed)
LVM for pt to call about results. Gave [facility] phone number and hours.  

## 2015-04-13 NOTE — Telephone Encounter (Signed)
Spoke to pt about labs per Dr Jaynee Eagles note. Pt verbalized understanding. Advised he can f/u with PCP and they can do repeat labs. Advised to limit amount of carbs/sugar and to get daily exercise.

## 2015-04-13 NOTE — Telephone Encounter (Signed)
-----   Message from Melvenia Beam, MD sent at 04/12/2015  9:23 PM EST ----- All labs are normal except slightly elevated hgba1c at 5.8 thanks

## 2015-05-14 ENCOUNTER — Ambulatory Visit (INDEPENDENT_AMBULATORY_CARE_PROVIDER_SITE_OTHER): Payer: Self-pay | Admitting: Neurology

## 2015-05-14 ENCOUNTER — Ambulatory Visit (INDEPENDENT_AMBULATORY_CARE_PROVIDER_SITE_OTHER): Payer: Medicare Other | Admitting: Neurology

## 2015-05-14 DIAGNOSIS — Z0289 Encounter for other administrative examinations: Secondary | ICD-10-CM

## 2015-05-14 DIAGNOSIS — R208 Other disturbances of skin sensation: Secondary | ICD-10-CM

## 2015-05-14 DIAGNOSIS — R200 Anesthesia of skin: Secondary | ICD-10-CM

## 2015-05-14 DIAGNOSIS — M25571 Pain in right ankle and joints of right foot: Secondary | ICD-10-CM

## 2015-05-14 DIAGNOSIS — G5751 Tarsal tunnel syndrome, right lower limb: Secondary | ICD-10-CM

## 2015-05-14 NOTE — Progress Notes (Signed)
  [facility]    Provider:  Dr Jaynee Eagles Referring Provider: Hulan Fess, MD Primary Care Physician:  Gennette Pac, MD  CC: Numbness in the right heel and toes.   HPI: George Mckay is a 70 y.o. male here as a referral from Dr. Rex Kras for numbness and pain on the bottom of the right foot. He feels it on the ball of the foot and it radiates up the middle of the bottom of the foot but more on the right as can feel the little toe is more numb.No tingling or burning.PMHx of elevated fasting glucose, HLD, abnormal kidney fx with gfr 56, lumbar radiculopathy. Started about 6-7 months ago. No inciting factors. Used to be in the heel and now extends to the toes. He had a neuroma in the ball of his foot. He has sciatica and thought it was part of that but Dr. Jacelyn Grip said it isn't related to his low back and the right foot numbness is separate from the sciatic pain. No cramping in the feet. Left leg and foot is unaffected. He has sciatica that starts in the low back and radiates to the buttocks, pain in the foot is different. No Hx of diabetes, hasn't drank in 17 years, no exposure to toxins or medications that can cause neuropathy.  Numbness of the heel is is all day long. No burning or tingling.    Summary  Nerve conduction studies were performed on the left upper and left lower extremities:  The bilateral Peroneal motor nerves showed normal conductions with normal F Wave latencies The bilateral Tibial motor nerves showed normal conductions with normal F Wave latencies The bilateral Sural sensory nerve conductions were within normal limits The bilateral Superficial Peroneal sensory nerve conductions were within normal limits Bilateral H Reflexes showed normal latencies  The bilateral lateral and medial plantar mixed nerves were within normal limits however there was reduced amplitude of the left lateral plantar mixed nerve as compared to the right Lateral Plantar Mixed Nerve.     EMG Needle study was performed on selected right lower extremity muscles:   The  Gluteus Medius, Gluteus Maximus, Biceps Femoris(long head),  Vastus Medialis, Anterior Tibialis, Medial Gastrocnemius, Extensor Hallucis Longus, Abductor Hallucis and Abductor Digiti Quinti muscles were within normal limits.  Conclusion: All nerve conduction studies and EMG needle exam of muscles were within normal limits. 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 The tibial nerve runs distal to the medial malleolus and then divides into branches including the medial and lateral calcaneus sensory nerves which provides sensory to the heel as well as the medial and lateral plantar nerves which  supply the medial and lateral sole of the foot. Given patient's symptoms of heel and lateral numbness on the bottom of the feet and the decreased amplitude of the left lateral plantar mixed nerve as compared to the right will perform an MRI of the ankle specifically looking at the tarsal tunnel. Can't rule out mild early polyneuropathy.   Sarina Ill, MD  [facility] [address] [place], [place] [address]  Phone [phone] Fax [phone]

## 2015-05-14 NOTE — Progress Notes (Signed)
See progress note.

## 2015-05-19 NOTE — Procedures (Signed)
  [facility]    Provider:  Dr Jaynee Eagles Referring Provider: Hulan Fess, MD Primary Care Physician:  Gennette Pac, MD  CC: Numbness in the right heel and toes.   HPI: George Mckay is a 70 y.o. male here as a referral from Dr. Rex Kras for numbness and pain on the bottom of the right foot. He feels it on the ball of the foot and it radiates up the middle of the bottom of the foot but more on the right as can feel the little toe is more numb.No tingling or burning.PMHx of elevated fasting glucose, HLD, abnormal kidney fx with gfr 56, lumbar radiculopathy. Started about 6-7 months ago. No inciting factors. Used to be in the heel and now extends to the toes. He had a neuroma in the ball of his foot. He has sciatica and thought it was part of that but Dr. Jacelyn Grip said it isn't related to his low back and the right foot numbness is separate from the sciatic pain. No cramping in the feet. Left leg and foot is unaffected. He has sciatica that starts in the low back and radiates to the buttocks, pain in the foot is different. No Hx of diabetes, hasn't drank in 17 years, no exposure to toxins or medications that can cause neuropathy.  Numbness of the heel is is all day long. No burning or tingling.    Summary  Nerve conduction studies were performed on the left upper and left lower extremities:  The bilateral Peroneal motor nerves showed normal conductions with normal F Wave latencies The bilateral Tibial motor nerves showed normal conductions with normal F Wave latencies The bilateral Sural sensory nerve conductions were within normal limits The bilateral Superficial Peroneal sensory nerve conductions were within normal limits Bilateral H Reflexes showed normal latencies  The bilateral lateral and medial plantar mixed nerves were within normal limits however there was reduced amplitude of the left lateral plantar mixed nerve as compared to the right Lateral Plantar Mixed Nerve.     EMG Needle study was performed on selected right lower extremity muscles:   The  Gluteus Medius, Gluteus Maximus, Biceps Femoris(long head),  Vastus Medialis, Anterior Tibialis, Medial Gastrocnemius, Extensor Hallucis Longus, Abductor Hallucis and Abductor Digiti Quinti muscles were within normal limits.  Conclusion: All nerve conduction studies and EMG needle exam of muscles were within normal limits. 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 The tibial nerve runs distal to the medial malleolus and then divides into branches including the medial and lateral calcaneus sensory nerves which provides sensory to the heel as well as the medial and lateral plantar nerves which  supply the medial and lateral sole of the foot. Given patient's symptoms of heel and lateral numbness on the bottom of the feet and the decreased amplitude of the left lateral plantar mixed nerve as compared to the right will perform an MRI of the ankle specifically looking at the tarsal tunnel. Can't rule out mild early polyneuropathy.   Sarina Ill, MD  [facility] [address] [place],  [address]  Phone [phone] Fax [phone]

## 2015-05-22 ENCOUNTER — Ambulatory Visit
Admission: RE | Admit: 2015-05-22 | Discharge: 2015-05-22 | Disposition: A | Discharge status: Home / Self Care | Payer: Medicare Other | Source: Ambulatory Visit | Attending: Neurology | Admitting: Neurology

## 2015-05-22 DIAGNOSIS — R200 Anesthesia of skin: Secondary | ICD-10-CM

## 2015-05-22 DIAGNOSIS — M19071 Primary osteoarthritis, right ankle and foot: Secondary | ICD-10-CM | POA: Diagnosis not present

## 2015-05-22 DIAGNOSIS — G5751 Tarsal tunnel syndrome, right lower limb: Secondary | ICD-10-CM

## 2015-05-22 DIAGNOSIS — M25571 Pain in right ankle and joints of right foot: Secondary | ICD-10-CM

## 2015-05-23 ENCOUNTER — Telehealth: Payer: Self-pay | Admitting: *Deleted

## 2015-05-23 NOTE — Telephone Encounter (Signed)
-----   Message from Melvenia Beam, MD sent at 05/23/2015 12:36 PM EDT ----- MRi of the ankle did not show any nerve impingement. Tried calling patient, line busy. Please let him know.

## 2015-05-23 NOTE — Telephone Encounter (Signed)
Called pt about MRI ankle results per Dr Jaynee Eagles note. Pt verbalized understanding.

## 2015-07-13 ENCOUNTER — Telehealth: Payer: Self-pay | Admitting: *Deleted

## 2015-07-13 NOTE — Telephone Encounter (Signed)
Pt called need more details on the MRI. Please call to advised  [phone].

## 2015-07-13 NOTE — Telephone Encounter (Signed)
Dr Jaynee Eagles- please advise  Called pt back. He stated he understands results of MRI, but he wants to know next steps. Advised I will have to speak to Dr Jaynee Eagles and call him back to advise. I do not see f/u scheduled. Advised Dr Jaynee Eagles is seeing pt until 1230 or so today. I will try to call him back before I leave, if not, I will let Dr Jaynee Eagles know to give him a call. He verbalized understanding.

## 2015-07-13 NOTE — Telephone Encounter (Signed)
Terrence Dupont, have him follow up with me in the office so we can discuss thanks

## 2015-07-16 NOTE — Telephone Encounter (Signed)
Called pt. Made f/u per Dr Jaynee Eagles request for 5/11 at 4pm, check in 345pm. Pt verbalized understanding.

## 2015-07-19 ENCOUNTER — Ambulatory Visit (INDEPENDENT_AMBULATORY_CARE_PROVIDER_SITE_OTHER): Payer: Medicare Other | Admitting: Neurology

## 2015-07-19 VITALS — BP 116/79 | HR 84 | Ht 66.0 in | Wt 140.4 lb

## 2015-07-19 DIAGNOSIS — G629 Polyneuropathy, unspecified: Secondary | ICD-10-CM | POA: Diagnosis not present

## 2015-07-19 NOTE — Progress Notes (Signed)
[facility]    Provider:  Dr Jaynee Eagles Referring Provider: Hulan Fess, MD Primary Care Physician:  Gennette Pac, MD   CC: Numbness in the right heel and toes. Worsening.   07/20/2015: He comes back today for follow-up. He still experiencing numbness on the bottom of the right foot and the heel. Its progressing and now extends to the middle of the foot. PMHx of elevated fasting glucose, HLD, abnormal kidney fx with gfr 56, lumbar radiculopathy. Symptoms started about a year ago. No inciting factors. He had a neuroma in the ball of his foot at one time. He had sciatica which is resolved and MRI of the lumbar spine was unremarkable. EMG nerve conduction study was unremarkable and serum lab testing has been unrevealing. Discussed with patient today the etiology of his foot numbness is unknown, could be idiopathic small fiber neuropathy and I did offer him a skin biopsy to further evaluate. We'll refer him to Dr. Krista Blue for second opinion on small fiber neuropathy and possible skin biopsy.  MRI of the lumbar spine unremarkable per report, patient without radiculopathy. He was evaluated at [facility] who did not find etiology for his right numbness.  Conclusion: All nerve conduction studies and EMG needle exam of muscles were within normal limits. 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 The tibial nerve runs distal to the medial malleolus and then divides into branches including the medial and lateral calcaneus sensory nerves which provides sensory to the heel as well as the medial and lateral plantar nerves which supply the medial and lateral sole of the foot. Given patient's symptoms of heel and lateral numbness on the bottom of the feet and the decreased amplitude of the left lateral plantar mixed nerve as compared to the right will perform an MRI of the  ankle specifically looking at the tarsal tunnel. Can't rule out mild early polyneuropathy.  MRI of the right ankle was normal without tarsal tunnel etiology.  Following labs were normal: Sjogren's antibodies, pan-Anka, sarcoid, thyroid, HIV, sedimentation rate, ANA, B12 and folate, RPR, hepatitis C, rheumatoid factor, heavy metals, vitamin B6, multiple myeloma panel, Lyme, vitamin B1. Hemoglobin A1c 5.8.   HPI: George Mckay is a 70 y.o. male here as a referral from Dr. Rex Kras for numbness and pain in the right foot. PMHx of elevated fasting glucose, HLD, abnormal kidney fx with gfr 56, lumbar radiculopathy. Started about 6-7 months ago. No inciting factors. Hurts after he gets up and it radiates up the leg, the lateral side up to the hip. Worsening, used to be in the heel and now extends to the toes. He had a neuroma in the ball of his foot. He has sciatica and thought it was part of that but Dr. Jacelyn Grip said it isn't related to his low back. No cramping in the feet. Left leg and foot is unaffected. He has sciatica that starts in the low back and radiates to the buttocks, pain in the foot is different. No Hx of diabetes, hasn't drank in 17 years, no exposure to toxins or medications that can cause neuropathy. In the morning he has some decreased balance but it improves after he gets up. Numbness of the heel is is all day long. No burning or tingling. If he sits too long it will start to hurt and radiate up to the hip again which lasts less than 5 minutes and is a  5/10 in pain. Walking around helps it. Worse when sitting for a long time. Feet do not feel cold. No weakness. No change in bowel or bladder.   Reviewed notes, labs and imaging from outside physicians, which showed:   CBC w diff, cmp unremarkable except creatinine 1.25 and glucose 135. ldl 141.   Review of Systems: Patient complains of symptoms per HPI as well as the following symptoms: hearing loss, ringing in ears, rash. Pertinent negatives  per HPI. All others negative.   Social History   Social History  . Marital Status: Married    Spouse Name: Shirlee Limerick  . Number of Children: 3  . Years of Education: 16   Occupational History  . Retired    Social History Main Topics  . Smoking status: Never Smoker   . Smokeless tobacco: Not on file  . Alcohol Use: No  . Drug Use: No  . Sexual Activity: Yes   Other Topics Concern  . Not on file   Social History Narrative   Lives with wife.    Caffeine use: Tea daily    Family History  Problem Relation Age of Onset  . Cancer Mother   . Cancer Sister   . Neuropathy Neg Hx     Past Medical History  Diagnosis Date  . Hyperlipidemia   . White coat syndrome with high blood pressure without hypertension   . Mild anemia   . Tear of left rotator cuff   . Chronic kidney disease   . Lumbar radiculopathy, chronic 04/09/2015    Past Surgical History  Procedure Laterality Date  . Cardiac catheterization  about 1999  . Colonoscopy    . Shoulder arthroscopy with subacromial decompression Left 03/27/2014    Procedure: SHOULDER ARTHROSCOPY WITH SUBACROMIAL DECOMPRESSION;  Surgeon: Nita Sells, MD;  Location: [facility];  Service: Orthopedics;  Laterality: Left;  Left shoulder arthroscopy rotator cuff debridement, subacromial decompression, distal clavical excision  . Resection distal clavical Left 03/27/2014    Procedure: RESECTION DISTAL CLAVICAL;  Surgeon: Nita Sells, MD;  Location: [facility];  Service: Orthopedics;  Laterality: Left;    Current Outpatient Prescriptions  Medication Sig Dispense Refill  . aspirin EC 81 MG tablet Take 81 mg by mouth daily.    . metroNIDAZOLE (METROCREAM) 0.75 % cream Apply 1 application topically 2 (two) times daily.     No current facility-administered medications for this visit.    Allergies as of 07/19/2015  . (No Known Allergies)    Vitals: BP 116/79 mmHg  Pulse 84  Ht '5\' 6"'$   (1.676 m)  Wt 140 lb 6.4 oz (63.685 kg)  BMI 22.67 kg/m2 Last Weight:  Wt Readings from Last 1 Encounters:  07/19/15 140 lb 6.4 oz (63.685 kg)   Last Height:   Ht Readings from Last 1 Encounters:  07/19/15 '5\' 6"'$  (1.676 m)   Physical exam: Exam: Gen: NAD, conversant, well nourised, obese, well groomed                     CV: RRR, no MRG. No Carotid Bruits. No peripheral edema, warm, nontender Eyes: Conjunctivae clear without exudates or hemorrhage  Neuro: Detailed Neurologic Exam  Speech:    Speech is normal; fluent and spontaneous with normal comprehension.  Cognition:    The patient is oriented to person, place, and time;     recent and remote memory intact;     language fluent;     normal attention, concentration,  fund of knowledge Cranial Nerves:    The pupils are equal, round, and reactive to light. The fundi are normal and spontaneous venous pulsations are present. Visual fields are full to finger confrontation. Extraocular movements are intact. Trigeminal sensation is intact and the muscles of mastication are normal. The face is symmetric. The palate elevates in the midline. Hearing intact. Voice is normal. Shoulder shrug is normal. The tongue has normal motion without fasciculations.   Coordination:    Normal finger to nose and heel to shin. Normal rapid alternating movements.   Gait:    Heel-toe and tandem gait are normal.   Motor Observation:    No asymmetry, no atrophy, and no involuntary movements noted. Tone:    Normal muscle tone.    Posture:    Posture is normal. normal erect    Strength:    Strength is V/V in the upper and lower limbs.      Sensation: intact to LT, pin prick, temp, vibration and proprioception distally in the feet. No decrease in sensation on the bottom of his feet on exam.   Reflex Exam:  DTR's: absent reflex right AJ. Otherwise deep tendon reflexes in the upper and lower extremities are normal bilaterally.   Toes:     The toes are downgoing bilaterally.   Clonus:    Clonus is absent.       Assessment/Plan:  70 year old patient with numbness in the bottom of his right foot, unknown etiology, workup negative. MN skin fiber testing for small fiber neuropathy with Dr. Krista Blue, will refer to her for evaluation of my workup in her opinion on whether this test would be helpful.  MRI of the lumbar spine unremarkable per report, patient without radiculopathy.  EMG/NCS: All nerve conduction studies and EMG needle exam of muscles were within normal limits. 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 The tibial nerve runs distal to the medial malleolus and then divides into branches including the medial and lateral calcaneus sensory nerves which provides sensory to the heel as well as the medial and lateral plantar nerves which supply the medial and lateral sole of the foot. Given patient's symptoms of heel and lateral numbness on the bottom of the feet and the decreased amplitude of the left lateral plantar mixed nerve as compared to the right will perform an MRI of the ankle specifically looking at the tarsal tunnel. Can't rule out mild early polyneuropathy.  MRI of the right ankle was normal without tarsal tunnel etiology.He was evaluated at [facility] who did not find etiology for his right numbness.  Following labs were normal: Sjogren's antibodies, pan-Anka, sarcoid, thyroid, HIV, sedimentation rate, ANA, B12 and folate, RPR, hepatitis C, rheumatoid factor, heavy metals, vitamin B6, multiple myeloma panel, Lyme, vitamin B1. Hemoglobin A1c 5.8.  Sarina Ill, MD  [facility] [address] [place], [place] [address]  Phone [phone] Fax [phone]  A total of 30 minutes was spent face-to-face with this patient. Over half this time was spent on counseling  patient on the idiopathic neuropathy diagnosis and different diagnostic and therapeutic options available.

## 2015-07-20 ENCOUNTER — Encounter: Payer: Self-pay | Admitting: Neurology

## 2015-07-20 LAB — PAN-ANCA: Myeloperoxidase Ab: <9 U/mL (ref 0.0–9.0)

## 2015-07-20 LAB — SJOGREN'S SYNDROME ANTIBODS(SSA + SSB)
ENA SSA (RO) Ab: <0.2 AI (ref 0.0–0.9)
ENA SSB (LA) Ab: <0.2 AI (ref 0.0–0.9)

## 2015-07-23 ENCOUNTER — Telehealth: Payer: Self-pay

## 2015-07-23 NOTE — Telephone Encounter (Signed)
-----   Message from Melvenia Beam, MD sent at 07/20/2015  5:20 PM EDT ----- Labs normal thanks

## 2015-07-23 NOTE — Telephone Encounter (Signed)
I spoke to patient and he is aware that labs are WNL.

## 2015-07-31 ENCOUNTER — Encounter: Payer: Self-pay | Admitting: Neurology

## 2015-07-31 ENCOUNTER — Ambulatory Visit (INDEPENDENT_AMBULATORY_CARE_PROVIDER_SITE_OTHER): Payer: Medicare Other | Admitting: Neurology

## 2015-07-31 VITALS — BP 110/73 | HR 79 | Ht 66.0 in | Wt 142.0 lb

## 2015-07-31 DIAGNOSIS — M5416 Radiculopathy, lumbar region: Secondary | ICD-10-CM | POA: Diagnosis not present

## 2015-07-31 NOTE — Progress Notes (Signed)
PATIENT: George Mckay DOB: 22-Jun-1945  Chief Complaint  Patient presents with  . Peripheral Neuropathy    He is here to discuss further testing and treatment options for his neuropathy.  Reports his symptoms to only be present in his right foot.     HISTORICAL  George Mckay is a 70 years old right-handed male, seen in refer by Dr. Jaynee Eagles for evaluation of right plantar foot numbness.  He had a history of chronic low back pain, had occasionally flareup, in summer of 2016, after heavy lifting, he notice right side low back pain, radiating pain to his right hip, this time is also radiating to right calf, involving right plantar foot, he had right heel numbness with right-sided low back pain.  His right low back pain relieved after back stretching exercise, but to right heel numbness had persistent, actually it progressed to involving right arch, right ball area.  He denies right foot needle prickly, burning pain, no gait difficulty, no left foot paresthesia, he denies bowel and bladder incontinence. He was initially evaluated by orthopedics surgeon  We have personally reviewed MRI lumbar in July 2016: L5-S1, right paracentral disc protrusion, with facet arthropathy, with mild right foraminal narrowing  EMG nerve conduction study in March 2017 showed no large fiber peripheral neuropathy, there was no evidence of right lumbosacral radiculopathy  I personally reviewed laboratory evaluation in 2017, normal or negative ANA, TSH, B12,  RPR, HIV, vitamin B6  REVIEW OF SYSTEMS: Full 14 system review of systems performed and notable only for numbness of right foot  ALLERGIES: No Known Allergies  HOME MEDICATIONS: Current Outpatient Prescriptions  Medication Sig Dispense Refill  . aspirin EC 81 MG tablet Take 81 mg by mouth daily.    . metroNIDAZOLE (METROCREAM) 0.75 % cream Apply 1 application topically 2 (two) times daily.     No current facility-administered medications for this visit.     PAST MEDICAL HISTORY: Past Medical History  Diagnosis Date  . Hyperlipidemia   . White coat syndrome with high blood pressure without hypertension   . Mild anemia   . Tear of left rotator cuff   . Chronic kidney disease   . Lumbar radiculopathy, chronic 04/09/2015    PAST SURGICAL HISTORY: Past Surgical History  Procedure Laterality Date  . Cardiac catheterization  about 1999  . Colonoscopy    . Shoulder arthroscopy with subacromial decompression Left 03/27/2014    Procedure: SHOULDER ARTHROSCOPY WITH SUBACROMIAL DECOMPRESSION;  Surgeon: Nita Sells, MD;  Location: [facility];  Service: Orthopedics;  Laterality: Left;  Left shoulder arthroscopy rotator cuff debridement, subacromial decompression, distal clavical excision  . Resection distal clavical Left 03/27/2014    Procedure: RESECTION DISTAL CLAVICAL;  Surgeon: Nita Sells, MD;  Location: [facility];  Service: Orthopedics;  Laterality: Left;    FAMILY HISTORY: Family History  Problem Relation Age of Onset  . Cancer Mother   . Cancer Sister   . Neuropathy Neg Hx     SOCIAL HISTORY:  Social History   Social History  . Marital Status: Married    Spouse Name: Shirlee Limerick  . Number of Children: 3  . Years of Education: 16   Occupational History  . Retired    Social History Main Topics  . Smoking status: Never Smoker   . Smokeless tobacco: Not on file  . Alcohol Use: No  . Drug Use: No  . Sexual Activity: Yes   Other Topics Concern  .  Not on file   Social History Narrative   Lives with wife.    Caffeine use: Tea daily     PHYSICAL EXAM   Filed Vitals:   07/31/15 1022  BP: 110/73  Pulse: 79  Height: 5\' 6"  (1.676 m)  Weight: 142 lb (64.411 kg)    Not recorded      Body mass index is 22.93 kg/(m^2).  PHYSICAL EXAMNIATION:  Gen: NAD, conversant, well nourised, obese, well groomed                     Cardiovascular: Regular rate rhythm, no  peripheral edema, warm, nontender. Eyes: Conjunctivae clear without exudates or hemorrhage Neck: Supple, no carotid bruise. Pulmonary: Clear to auscultation bilaterally   NEUROLOGICAL EXAM:  MENTAL STATUS: Speech:    Speech is normal; fluent and spontaneous with normal comprehension.  Cognition:     Orientation to time, place and person     Normal recent and remote memory     Normal Attention span and concentration     Normal Language, naming, repeating,spontaneous speech     Fund of knowledge   CRANIAL NERVES: CN II: Visual fields are full to confrontation. Fundoscopic exam is normal with sharp discs and no vascular changes. Pupils are round equal and briskly reactive to light. CN III, IV, VI: extraocular movement are normal. No ptosis. CN V: Facial sensation is intact to pinprick in all 3 divisions bilaterally. Corneal responses are intact.  CN VII: Face is symmetric with normal eye closure and smile. CN VIII: Hearing is normal to rubbing fingers CN IX, X: Palate elevates symmetrically. Phonation is normal. CN XI: Head turning and shoulder shrug are intact CN XII: Tongue is midline with normal movements and no atrophy.  MOTOR: There is no pronator drift of out-stretched arms. Muscle bulk and tone are normal. Muscle strength is normal.  REFLEXES: Reflexes are 2+ and symmetric at the biceps, triceps, knees, and he has absent right ankle reflex, left ankle reflex was present. Plantar responses are flexor.  SENSORY: Intact to light touch, pinprick, positional sensation and vibratory sensation are intact in fingers and toes.  COORDINATION: Rapid alternating movements and fine finger movements are intact. There is no dysmetria on finger-to-nose and heel-knee-shin.    GAIT/STANCE: Posture is normal. Gait is steady with normal steps, base, arm swing, and turning. Heel and toe walking are normal. Tandem gait is normal.  Romberg is absent.   DIAGNOSTIC DATA (LABS, IMAGING,  TESTING) - I reviewed patient records, labs, notes, testing and imaging myself where available.   ASSESSMENT AND PLAN  George Mckay is a 70 y.o. 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 Lavell Anchors in 6 months    Marcial Pacas, M.D. Ph.D.  [facility] [address] [place], [place] [address] Ph: [phone] Fax: [phone]  CC: Referring Provider

## 2015-08-02 ENCOUNTER — Telehealth: Payer: Self-pay | Admitting: *Deleted

## 2015-08-02 NOTE — Telephone Encounter (Signed)
Message For: OFFICE               Taken 25-MAY-17 at  1:31PM by JWT ------------------------------------------------------------ Chanda Busing                  CID [id number]  Patient SAME                 Pt's Dr Krista Blue          Area Code 336 Phone# [phone] RECORDS-MRI [place] [id number]            Disp:Y/N N If Y = C/B If No Response In 29minutes ============================================================ Lt voicemail, need a release form for pt.

## 2015-10-11 DIAGNOSIS — H5213 Myopia, bilateral: Secondary | ICD-10-CM | POA: Diagnosis not present

## 2015-10-11 DIAGNOSIS — H2513 Age-related nuclear cataract, bilateral: Secondary | ICD-10-CM | POA: Diagnosis not present

## 2016-02-04 ENCOUNTER — Ambulatory Visit: Payer: Medicare Other | Admitting: Neurology

## 2016-04-16 DIAGNOSIS — Z79899 Other long term (current) drug therapy: Secondary | ICD-10-CM | POA: Diagnosis not present

## 2016-04-16 DIAGNOSIS — N289 Disorder of kidney and ureter, unspecified: Secondary | ICD-10-CM | POA: Diagnosis not present

## 2016-04-16 DIAGNOSIS — M9983 Other biomechanical lesions of lumbar region: Secondary | ICD-10-CM | POA: Diagnosis not present

## 2016-04-16 DIAGNOSIS — E785 Hyperlipidemia, unspecified: Secondary | ICD-10-CM | POA: Diagnosis not present

## 2016-04-16 DIAGNOSIS — Z125 Encounter for screening for malignant neoplasm of prostate: Secondary | ICD-10-CM | POA: Diagnosis not present

## 2016-04-16 DIAGNOSIS — Z0000 Encounter for general adult medical examination without abnormal findings: Secondary | ICD-10-CM | POA: Diagnosis not present

## 2016-04-16 DIAGNOSIS — R7301 Impaired fasting glucose: Secondary | ICD-10-CM | POA: Diagnosis not present

## 2016-05-26 DIAGNOSIS — M5137 Other intervertebral disc degeneration, lumbosacral region: Secondary | ICD-10-CM | POA: Diagnosis not present

## 2016-05-26 DIAGNOSIS — M5135 Other intervertebral disc degeneration, thoracolumbar region: Secondary | ICD-10-CM | POA: Diagnosis not present

## 2016-05-26 DIAGNOSIS — M9901 Segmental and somatic dysfunction of cervical region: Secondary | ICD-10-CM | POA: Diagnosis not present

## 2016-05-26 DIAGNOSIS — M9903 Segmental and somatic dysfunction of lumbar region: Secondary | ICD-10-CM | POA: Diagnosis not present

## 2016-05-26 DIAGNOSIS — M9902 Segmental and somatic dysfunction of thoracic region: Secondary | ICD-10-CM | POA: Diagnosis not present

## 2016-05-26 DIAGNOSIS — M67911 Unspecified disorder of synovium and tendon, right shoulder: Secondary | ICD-10-CM | POA: Diagnosis not present

## 2016-05-26 DIAGNOSIS — M5033 Other cervical disc degeneration, cervicothoracic region: Secondary | ICD-10-CM | POA: Diagnosis not present

## 2016-05-29 DIAGNOSIS — M5033 Other cervical disc degeneration, cervicothoracic region: Secondary | ICD-10-CM | POA: Diagnosis not present

## 2016-05-29 DIAGNOSIS — M5135 Other intervertebral disc degeneration, thoracolumbar region: Secondary | ICD-10-CM | POA: Diagnosis not present

## 2016-05-29 DIAGNOSIS — M9902 Segmental and somatic dysfunction of thoracic region: Secondary | ICD-10-CM | POA: Diagnosis not present

## 2016-05-29 DIAGNOSIS — M9903 Segmental and somatic dysfunction of lumbar region: Secondary | ICD-10-CM | POA: Diagnosis not present

## 2016-05-29 DIAGNOSIS — M9901 Segmental and somatic dysfunction of cervical region: Secondary | ICD-10-CM | POA: Diagnosis not present

## 2016-05-29 DIAGNOSIS — M5137 Other intervertebral disc degeneration, lumbosacral region: Secondary | ICD-10-CM | POA: Diagnosis not present

## 2016-06-03 DIAGNOSIS — M5135 Other intervertebral disc degeneration, thoracolumbar region: Secondary | ICD-10-CM | POA: Diagnosis not present

## 2016-06-03 DIAGNOSIS — M5137 Other intervertebral disc degeneration, lumbosacral region: Secondary | ICD-10-CM | POA: Diagnosis not present

## 2016-06-03 DIAGNOSIS — M9902 Segmental and somatic dysfunction of thoracic region: Secondary | ICD-10-CM | POA: Diagnosis not present

## 2016-06-03 DIAGNOSIS — M9903 Segmental and somatic dysfunction of lumbar region: Secondary | ICD-10-CM | POA: Diagnosis not present

## 2016-06-03 DIAGNOSIS — M9901 Segmental and somatic dysfunction of cervical region: Secondary | ICD-10-CM | POA: Diagnosis not present

## 2016-06-03 DIAGNOSIS — M5033 Other cervical disc degeneration, cervicothoracic region: Secondary | ICD-10-CM | POA: Diagnosis not present

## 2016-06-05 DIAGNOSIS — M9903 Segmental and somatic dysfunction of lumbar region: Secondary | ICD-10-CM | POA: Diagnosis not present

## 2016-06-05 DIAGNOSIS — M9901 Segmental and somatic dysfunction of cervical region: Secondary | ICD-10-CM | POA: Diagnosis not present

## 2016-06-05 DIAGNOSIS — M5137 Other intervertebral disc degeneration, lumbosacral region: Secondary | ICD-10-CM | POA: Diagnosis not present

## 2016-06-05 DIAGNOSIS — M9902 Segmental and somatic dysfunction of thoracic region: Secondary | ICD-10-CM | POA: Diagnosis not present

## 2016-06-05 DIAGNOSIS — M5135 Other intervertebral disc degeneration, thoracolumbar region: Secondary | ICD-10-CM | POA: Diagnosis not present

## 2016-06-05 DIAGNOSIS — M5033 Other cervical disc degeneration, cervicothoracic region: Secondary | ICD-10-CM | POA: Diagnosis not present

## 2016-06-17 DIAGNOSIS — M7541 Impingement syndrome of right shoulder: Secondary | ICD-10-CM | POA: Diagnosis not present

## 2016-06-17 DIAGNOSIS — G8918 Other acute postprocedural pain: Secondary | ICD-10-CM | POA: Diagnosis not present

## 2016-06-17 DIAGNOSIS — M75111 Incomplete rotator cuff tear or rupture of right shoulder, not specified as traumatic: Secondary | ICD-10-CM | POA: Diagnosis not present

## 2016-06-17 DIAGNOSIS — M75121 Complete rotator cuff tear or rupture of right shoulder, not specified as traumatic: Secondary | ICD-10-CM | POA: Diagnosis not present

## 2016-06-17 DIAGNOSIS — M659 Synovitis and tenosynovitis, unspecified: Secondary | ICD-10-CM | POA: Diagnosis not present

## 2016-06-17 DIAGNOSIS — M7521 Bicipital tendinitis, right shoulder: Secondary | ICD-10-CM | POA: Diagnosis not present

## 2016-06-25 DIAGNOSIS — Z9889 Other specified postprocedural states: Secondary | ICD-10-CM | POA: Diagnosis not present

## 2016-06-27 DIAGNOSIS — M25611 Stiffness of right shoulder, not elsewhere classified: Secondary | ICD-10-CM | POA: Diagnosis not present

## 2016-06-27 DIAGNOSIS — M75121 Complete rotator cuff tear or rupture of right shoulder, not specified as traumatic: Secondary | ICD-10-CM | POA: Diagnosis not present

## 2016-07-02 DIAGNOSIS — M25611 Stiffness of right shoulder, not elsewhere classified: Secondary | ICD-10-CM | POA: Diagnosis not present

## 2016-07-02 DIAGNOSIS — M75121 Complete rotator cuff tear or rupture of right shoulder, not specified as traumatic: Secondary | ICD-10-CM | POA: Diagnosis not present

## 2016-07-04 DIAGNOSIS — M25611 Stiffness of right shoulder, not elsewhere classified: Secondary | ICD-10-CM | POA: Diagnosis not present

## 2016-07-04 DIAGNOSIS — M75121 Complete rotator cuff tear or rupture of right shoulder, not specified as traumatic: Secondary | ICD-10-CM | POA: Diagnosis not present

## 2016-07-07 DIAGNOSIS — M75121 Complete rotator cuff tear or rupture of right shoulder, not specified as traumatic: Secondary | ICD-10-CM | POA: Diagnosis not present

## 2016-07-07 DIAGNOSIS — M25611 Stiffness of right shoulder, not elsewhere classified: Secondary | ICD-10-CM | POA: Diagnosis not present

## 2016-07-10 DIAGNOSIS — M25611 Stiffness of right shoulder, not elsewhere classified: Secondary | ICD-10-CM | POA: Diagnosis not present

## 2016-07-10 DIAGNOSIS — M75121 Complete rotator cuff tear or rupture of right shoulder, not specified as traumatic: Secondary | ICD-10-CM | POA: Diagnosis not present

## 2016-07-14 DIAGNOSIS — M75121 Complete rotator cuff tear or rupture of right shoulder, not specified as traumatic: Secondary | ICD-10-CM | POA: Diagnosis not present

## 2016-07-14 DIAGNOSIS — M25611 Stiffness of right shoulder, not elsewhere classified: Secondary | ICD-10-CM | POA: Diagnosis not present

## 2016-07-17 DIAGNOSIS — M25611 Stiffness of right shoulder, not elsewhere classified: Secondary | ICD-10-CM | POA: Diagnosis not present

## 2016-07-17 DIAGNOSIS — M75121 Complete rotator cuff tear or rupture of right shoulder, not specified as traumatic: Secondary | ICD-10-CM | POA: Diagnosis not present

## 2016-07-21 DIAGNOSIS — M75121 Complete rotator cuff tear or rupture of right shoulder, not specified as traumatic: Secondary | ICD-10-CM | POA: Diagnosis not present

## 2016-07-21 DIAGNOSIS — M25611 Stiffness of right shoulder, not elsewhere classified: Secondary | ICD-10-CM | POA: Diagnosis not present

## 2016-07-24 DIAGNOSIS — M25611 Stiffness of right shoulder, not elsewhere classified: Secondary | ICD-10-CM | POA: Diagnosis not present

## 2016-07-24 DIAGNOSIS — M75121 Complete rotator cuff tear or rupture of right shoulder, not specified as traumatic: Secondary | ICD-10-CM | POA: Diagnosis not present

## 2016-07-28 DIAGNOSIS — M25611 Stiffness of right shoulder, not elsewhere classified: Secondary | ICD-10-CM | POA: Diagnosis not present

## 2016-07-28 DIAGNOSIS — M75121 Complete rotator cuff tear or rupture of right shoulder, not specified as traumatic: Secondary | ICD-10-CM | POA: Diagnosis not present

## 2016-07-31 DIAGNOSIS — M25611 Stiffness of right shoulder, not elsewhere classified: Secondary | ICD-10-CM | POA: Diagnosis not present

## 2016-07-31 DIAGNOSIS — M75121 Complete rotator cuff tear or rupture of right shoulder, not specified as traumatic: Secondary | ICD-10-CM | POA: Diagnosis not present

## 2016-08-01 DIAGNOSIS — Z9889 Other specified postprocedural states: Secondary | ICD-10-CM | POA: Diagnosis not present

## 2016-08-05 DIAGNOSIS — M75121 Complete rotator cuff tear or rupture of right shoulder, not specified as traumatic: Secondary | ICD-10-CM | POA: Diagnosis not present

## 2016-08-05 DIAGNOSIS — M25611 Stiffness of right shoulder, not elsewhere classified: Secondary | ICD-10-CM | POA: Diagnosis not present

## 2016-08-07 DIAGNOSIS — M75121 Complete rotator cuff tear or rupture of right shoulder, not specified as traumatic: Secondary | ICD-10-CM | POA: Diagnosis not present

## 2016-08-07 DIAGNOSIS — M25611 Stiffness of right shoulder, not elsewhere classified: Secondary | ICD-10-CM | POA: Diagnosis not present

## 2016-08-11 DIAGNOSIS — M75121 Complete rotator cuff tear or rupture of right shoulder, not specified as traumatic: Secondary | ICD-10-CM | POA: Diagnosis not present

## 2016-08-11 DIAGNOSIS — M25611 Stiffness of right shoulder, not elsewhere classified: Secondary | ICD-10-CM | POA: Diagnosis not present

## 2016-08-14 DIAGNOSIS — M25611 Stiffness of right shoulder, not elsewhere classified: Secondary | ICD-10-CM | POA: Diagnosis not present

## 2016-08-14 DIAGNOSIS — M75121 Complete rotator cuff tear or rupture of right shoulder, not specified as traumatic: Secondary | ICD-10-CM | POA: Diagnosis not present

## 2016-08-18 DIAGNOSIS — M25611 Stiffness of right shoulder, not elsewhere classified: Secondary | ICD-10-CM | POA: Diagnosis not present

## 2016-08-18 DIAGNOSIS — M75121 Complete rotator cuff tear or rupture of right shoulder, not specified as traumatic: Secondary | ICD-10-CM | POA: Diagnosis not present

## 2016-08-21 DIAGNOSIS — M75121 Complete rotator cuff tear or rupture of right shoulder, not specified as traumatic: Secondary | ICD-10-CM | POA: Diagnosis not present

## 2016-08-21 DIAGNOSIS — M25611 Stiffness of right shoulder, not elsewhere classified: Secondary | ICD-10-CM | POA: Diagnosis not present

## 2016-08-25 DIAGNOSIS — M25611 Stiffness of right shoulder, not elsewhere classified: Secondary | ICD-10-CM | POA: Diagnosis not present

## 2016-08-25 DIAGNOSIS — M75121 Complete rotator cuff tear or rupture of right shoulder, not specified as traumatic: Secondary | ICD-10-CM | POA: Diagnosis not present

## 2016-08-28 DIAGNOSIS — M75121 Complete rotator cuff tear or rupture of right shoulder, not specified as traumatic: Secondary | ICD-10-CM | POA: Diagnosis not present

## 2016-08-28 DIAGNOSIS — M25611 Stiffness of right shoulder, not elsewhere classified: Secondary | ICD-10-CM | POA: Diagnosis not present

## 2016-09-01 DIAGNOSIS — M25611 Stiffness of right shoulder, not elsewhere classified: Secondary | ICD-10-CM | POA: Diagnosis not present

## 2016-09-01 DIAGNOSIS — M75121 Complete rotator cuff tear or rupture of right shoulder, not specified as traumatic: Secondary | ICD-10-CM | POA: Diagnosis not present

## 2016-09-04 DIAGNOSIS — M25611 Stiffness of right shoulder, not elsewhere classified: Secondary | ICD-10-CM | POA: Diagnosis not present

## 2016-09-04 DIAGNOSIS — M75121 Complete rotator cuff tear or rupture of right shoulder, not specified as traumatic: Secondary | ICD-10-CM | POA: Diagnosis not present

## 2016-09-08 DIAGNOSIS — M75121 Complete rotator cuff tear or rupture of right shoulder, not specified as traumatic: Secondary | ICD-10-CM | POA: Diagnosis not present

## 2016-09-08 DIAGNOSIS — M25611 Stiffness of right shoulder, not elsewhere classified: Secondary | ICD-10-CM | POA: Diagnosis not present

## 2016-09-15 DIAGNOSIS — M25611 Stiffness of right shoulder, not elsewhere classified: Secondary | ICD-10-CM | POA: Diagnosis not present

## 2016-09-15 DIAGNOSIS — M75121 Complete rotator cuff tear or rupture of right shoulder, not specified as traumatic: Secondary | ICD-10-CM | POA: Diagnosis not present

## 2016-09-15 DIAGNOSIS — Z9889 Other specified postprocedural states: Secondary | ICD-10-CM | POA: Diagnosis not present

## 2016-09-27 IMAGING — MR MR ANKLE*R* W/O CM
4 of 6 series · 23 of 40 positions shown · non-contrast
Comparison: None.

CLINICAL DATA: Numbness in the heel of the right foot and at the
arch for 10 months. Question tarsal tunnel some syndrome. No known
injury. Initial encounter.

EXAM:
MRI OF THE RIGHT ANKLE WITHOUT CONTRAST
TECHNIQUE: Multiplanar, multisequence MR imaging of the ankle was performed. No
intravenous contrast was administered.

[Series 3: PD fat-sat · axial · 3.5mm · 0.31mm/px · z∈[-97,+12]mm · 8 of 26 slices shown]
[im 1/26]
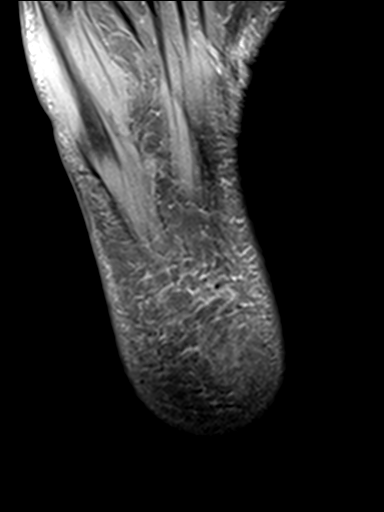
[im 4/26]
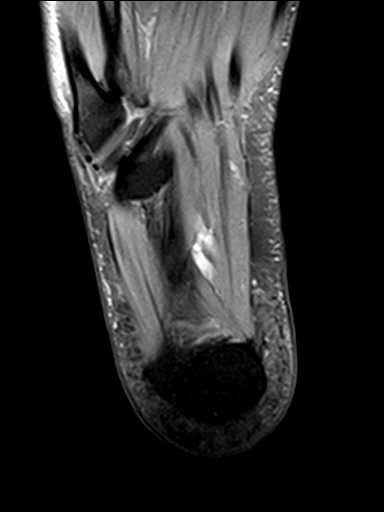
[im 8/26]
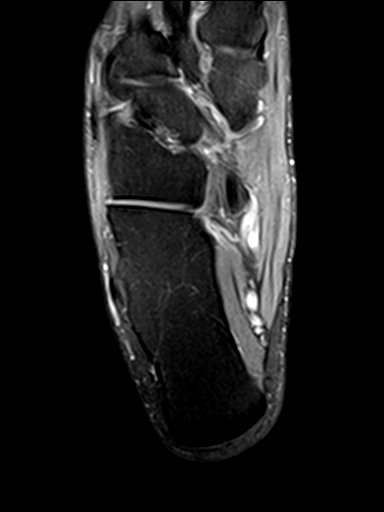
[im 11/26]
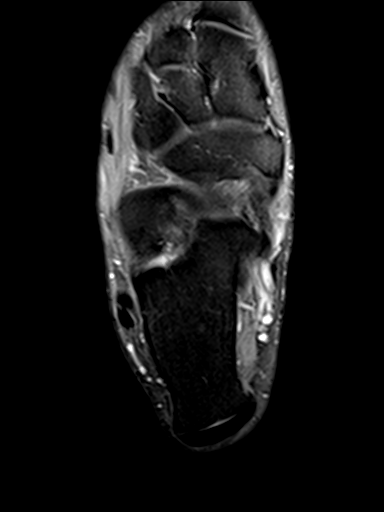
[im 15/26]
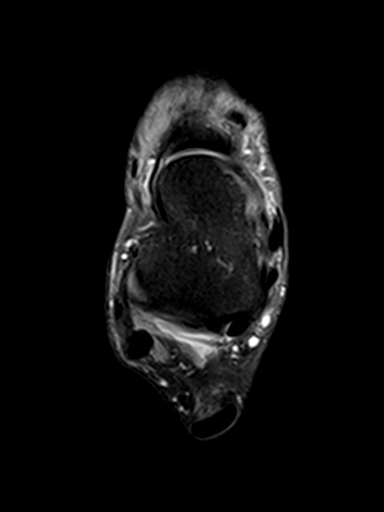
[im 18/26]
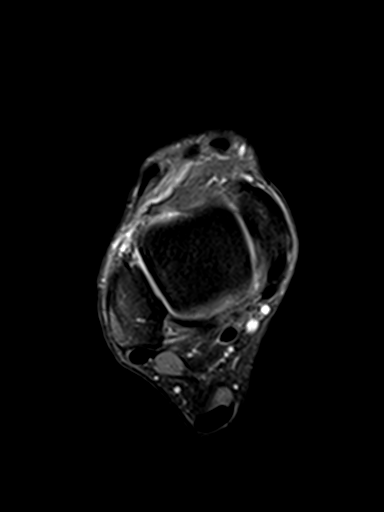
[im 22/26]
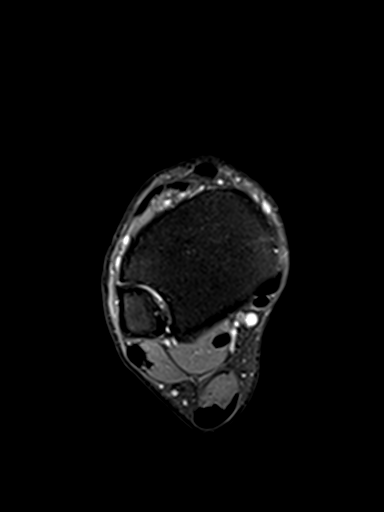
[im 26/26]
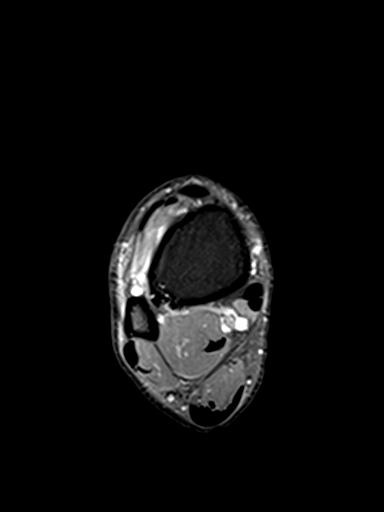

[Series 4: T2 fat-sat · axial · 3.5mm · 0.31mm/px · z∈[-97,+12]mm · 7 of 26 slices shown (1 of 3)]
[im 1/26]
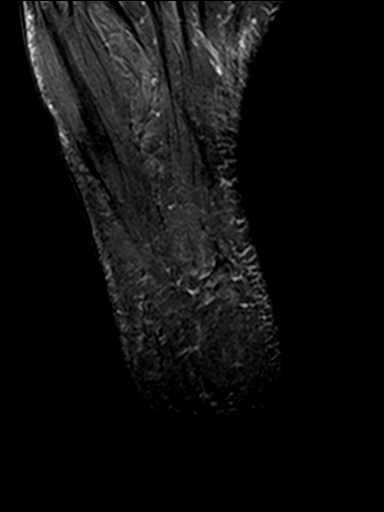
[im 5/26]
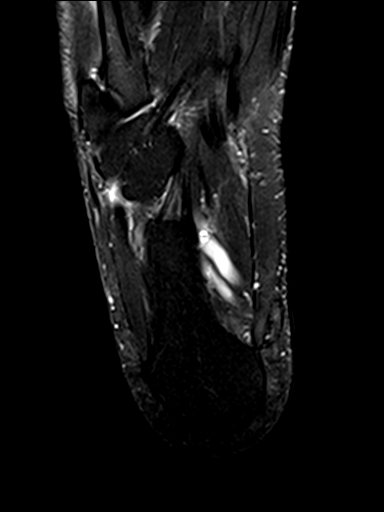
[im 9/26]
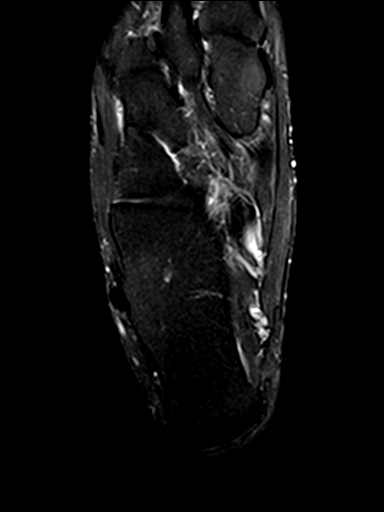
[im 13/26]
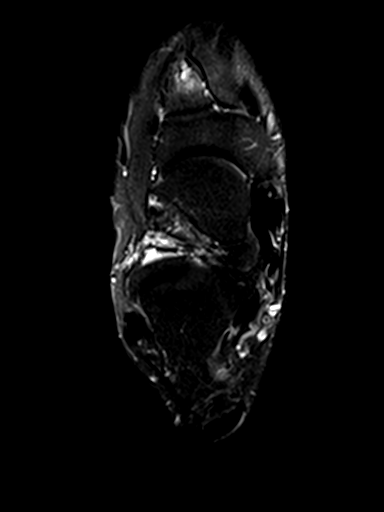
[im 17/26]
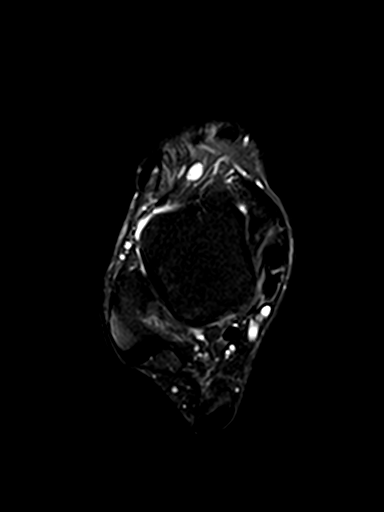
[im 21/26]
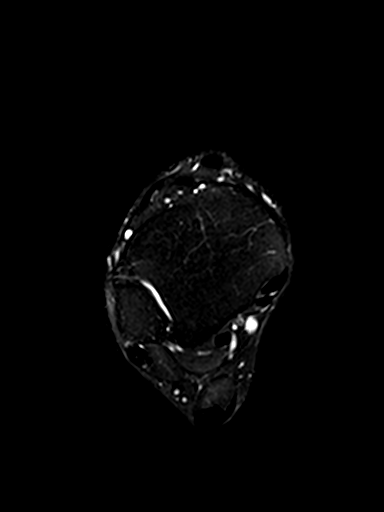
[im 26/26]
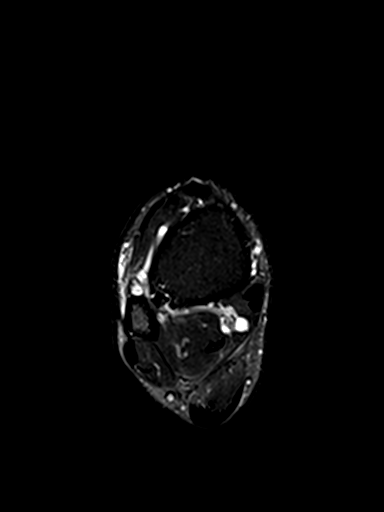

[Series 6: T2 fat-sat · sagittal · 3.0mm · 0.31mm/px · 5 of 20 slices shown (2 of 3)]
[im 1/20]
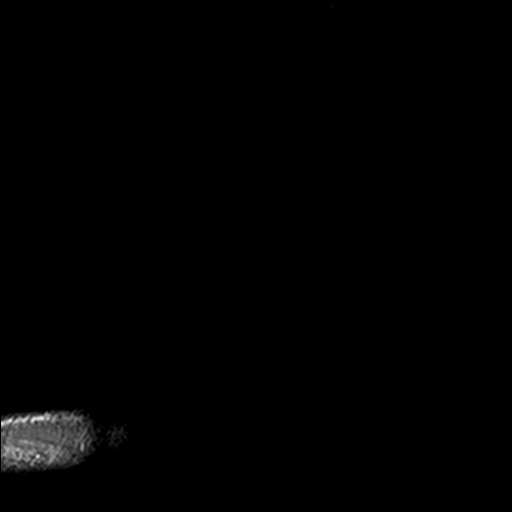
[im 5/20]
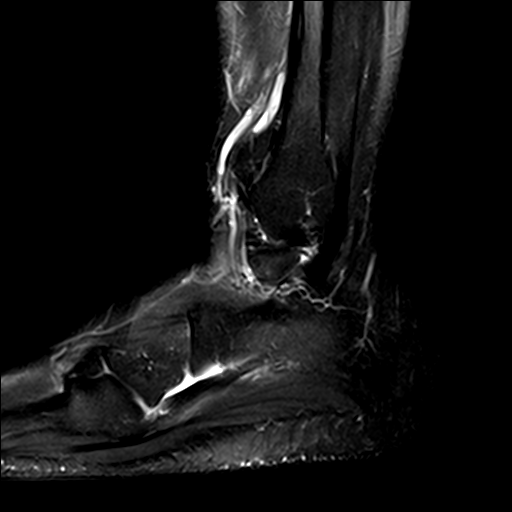
[im 10/20]
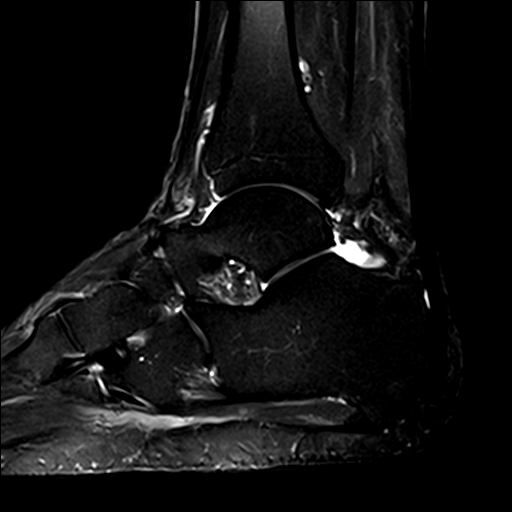
[im 15/20]
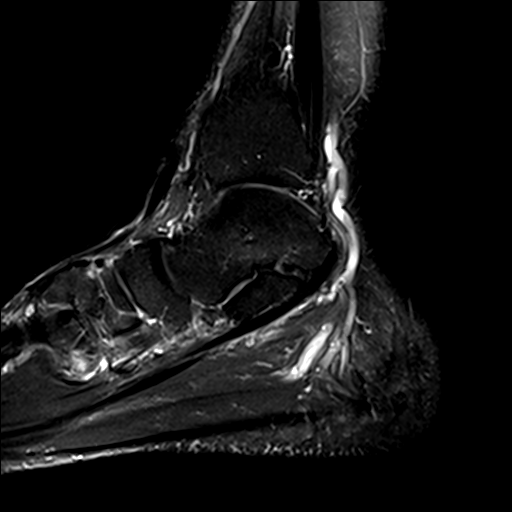
[im 20/20]
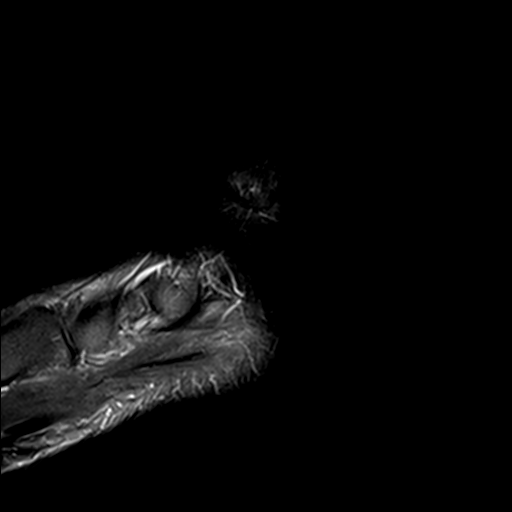

[Series 7: T2 fat-sat · coronal · 4.0mm · 0.31mm/px · 3 of 30 slices shown (3 of 3)]
[im 5/30]
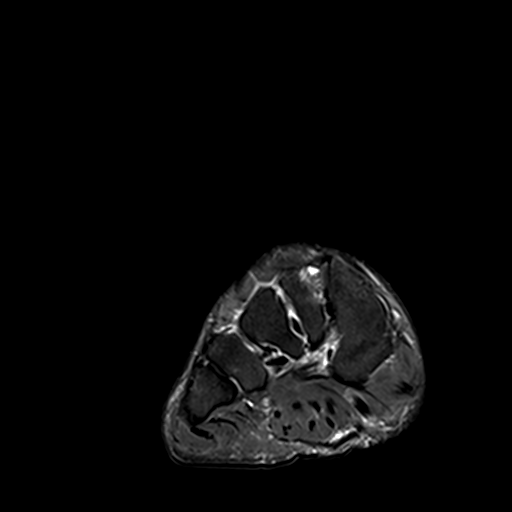
[im 17/30]
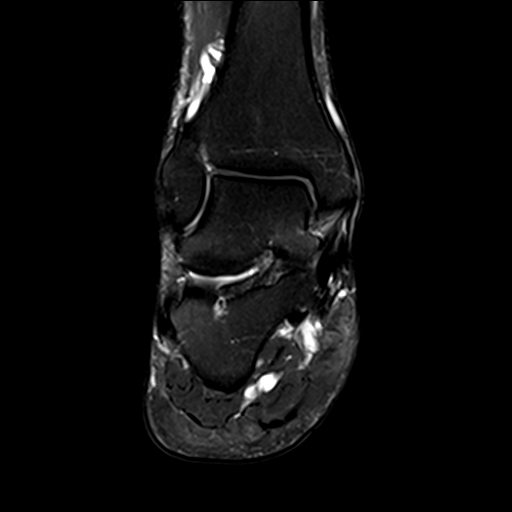
[im 25/30]
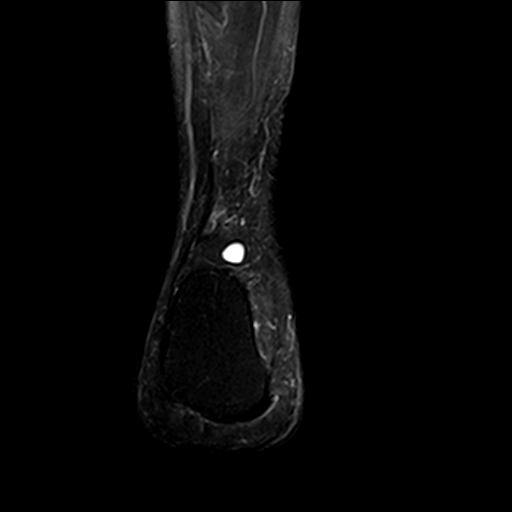

[23 of 40 positions shown; findings below may reference images not displayed]

FINDINGS: No fluid collection or mass is identified in the tarsal tunnel.

TENDONS

Peroneal: Intact.

Posteromedial: Intact.

Anterior: Intact.

Achilles: Intact.

Plantar Fascia: Appears normal.

LIGAMENTS

Lateral: Intact.

Medial: Intact.

CARTILAGE

Ankle Joint: Unremarkable.

Subtalar Joints/Sinus Tarsi: Unremarkable.

Bones: Small subchondral cysts and mild edema are seen in the middle
cuneiform and base of the second metatarsal. No fracture or
worrisome marrow lesion.
IMPRESSION: Negative for tarsal tunnel syndrome. Negative for ligament or tendon
tear.

Mild to moderate osteoarthritis second tarsometatarsal joint.

## 2016-09-30 DIAGNOSIS — M25611 Stiffness of right shoulder, not elsewhere classified: Secondary | ICD-10-CM | POA: Diagnosis not present

## 2016-09-30 DIAGNOSIS — M75121 Complete rotator cuff tear or rupture of right shoulder, not specified as traumatic: Secondary | ICD-10-CM | POA: Diagnosis not present

## 2016-10-02 DIAGNOSIS — M75121 Complete rotator cuff tear or rupture of right shoulder, not specified as traumatic: Secondary | ICD-10-CM | POA: Diagnosis not present

## 2016-10-02 DIAGNOSIS — M25611 Stiffness of right shoulder, not elsewhere classified: Secondary | ICD-10-CM | POA: Diagnosis not present

## 2016-10-06 DIAGNOSIS — M75121 Complete rotator cuff tear or rupture of right shoulder, not specified as traumatic: Secondary | ICD-10-CM | POA: Diagnosis not present

## 2016-10-06 DIAGNOSIS — M25611 Stiffness of right shoulder, not elsewhere classified: Secondary | ICD-10-CM | POA: Diagnosis not present

## 2016-10-09 DIAGNOSIS — M75121 Complete rotator cuff tear or rupture of right shoulder, not specified as traumatic: Secondary | ICD-10-CM | POA: Diagnosis not present

## 2016-10-09 DIAGNOSIS — M25611 Stiffness of right shoulder, not elsewhere classified: Secondary | ICD-10-CM | POA: Diagnosis not present

## 2016-10-13 DIAGNOSIS — M75121 Complete rotator cuff tear or rupture of right shoulder, not specified as traumatic: Secondary | ICD-10-CM | POA: Diagnosis not present

## 2016-10-13 DIAGNOSIS — M25611 Stiffness of right shoulder, not elsewhere classified: Secondary | ICD-10-CM | POA: Diagnosis not present

## 2016-10-16 DIAGNOSIS — M75121 Complete rotator cuff tear or rupture of right shoulder, not specified as traumatic: Secondary | ICD-10-CM | POA: Diagnosis not present

## 2016-10-16 DIAGNOSIS — M25611 Stiffness of right shoulder, not elsewhere classified: Secondary | ICD-10-CM | POA: Diagnosis not present

## 2016-10-27 DIAGNOSIS — M25611 Stiffness of right shoulder, not elsewhere classified: Secondary | ICD-10-CM | POA: Diagnosis not present

## 2016-10-27 DIAGNOSIS — Z09 Encounter for follow-up examination after completed treatment for conditions other than malignant neoplasm: Secondary | ICD-10-CM | POA: Diagnosis not present

## 2016-10-27 DIAGNOSIS — M75121 Complete rotator cuff tear or rupture of right shoulder, not specified as traumatic: Secondary | ICD-10-CM | POA: Diagnosis not present

## 2016-11-07 DIAGNOSIS — M25611 Stiffness of right shoulder, not elsewhere classified: Secondary | ICD-10-CM | POA: Diagnosis not present

## 2016-11-07 DIAGNOSIS — M75121 Complete rotator cuff tear or rupture of right shoulder, not specified as traumatic: Secondary | ICD-10-CM | POA: Diagnosis not present

## 2016-11-12 DIAGNOSIS — Z23 Encounter for immunization: Secondary | ICD-10-CM | POA: Diagnosis not present

## 2016-12-08 DIAGNOSIS — Z09 Encounter for follow-up examination after completed treatment for conditions other than malignant neoplasm: Secondary | ICD-10-CM | POA: Diagnosis not present

## 2017-01-08 DIAGNOSIS — Z23 Encounter for immunization: Secondary | ICD-10-CM | POA: Diagnosis not present

## 2017-04-14 DIAGNOSIS — M9903 Segmental and somatic dysfunction of lumbar region: Secondary | ICD-10-CM | POA: Diagnosis not present

## 2017-04-14 DIAGNOSIS — M5417 Radiculopathy, lumbosacral region: Secondary | ICD-10-CM | POA: Diagnosis not present

## 2017-04-14 DIAGNOSIS — M9902 Segmental and somatic dysfunction of thoracic region: Secondary | ICD-10-CM | POA: Diagnosis not present

## 2017-04-14 DIAGNOSIS — M9904 Segmental and somatic dysfunction of sacral region: Secondary | ICD-10-CM | POA: Diagnosis not present

## 2017-04-14 DIAGNOSIS — M5414 Radiculopathy, thoracic region: Secondary | ICD-10-CM | POA: Diagnosis not present

## 2017-04-14 DIAGNOSIS — M5137 Other intervertebral disc degeneration, lumbosacral region: Secondary | ICD-10-CM | POA: Diagnosis not present

## 2017-04-16 DIAGNOSIS — M9902 Segmental and somatic dysfunction of thoracic region: Secondary | ICD-10-CM | POA: Diagnosis not present

## 2017-04-16 DIAGNOSIS — M9903 Segmental and somatic dysfunction of lumbar region: Secondary | ICD-10-CM | POA: Diagnosis not present

## 2017-04-16 DIAGNOSIS — M9904 Segmental and somatic dysfunction of sacral region: Secondary | ICD-10-CM | POA: Diagnosis not present

## 2017-04-16 DIAGNOSIS — M5417 Radiculopathy, lumbosacral region: Secondary | ICD-10-CM | POA: Diagnosis not present

## 2017-04-16 DIAGNOSIS — M5137 Other intervertebral disc degeneration, lumbosacral region: Secondary | ICD-10-CM | POA: Diagnosis not present

## 2017-04-16 DIAGNOSIS — M5414 Radiculopathy, thoracic region: Secondary | ICD-10-CM | POA: Diagnosis not present

## 2017-04-20 DIAGNOSIS — M9904 Segmental and somatic dysfunction of sacral region: Secondary | ICD-10-CM | POA: Diagnosis not present

## 2017-04-20 DIAGNOSIS — M5137 Other intervertebral disc degeneration, lumbosacral region: Secondary | ICD-10-CM | POA: Diagnosis not present

## 2017-04-20 DIAGNOSIS — M5417 Radiculopathy, lumbosacral region: Secondary | ICD-10-CM | POA: Diagnosis not present

## 2017-04-20 DIAGNOSIS — M9902 Segmental and somatic dysfunction of thoracic region: Secondary | ICD-10-CM | POA: Diagnosis not present

## 2017-04-20 DIAGNOSIS — M9903 Segmental and somatic dysfunction of lumbar region: Secondary | ICD-10-CM | POA: Diagnosis not present

## 2017-04-20 DIAGNOSIS — M5414 Radiculopathy, thoracic region: Secondary | ICD-10-CM | POA: Diagnosis not present

## 2017-04-22 DIAGNOSIS — M5417 Radiculopathy, lumbosacral region: Secondary | ICD-10-CM | POA: Diagnosis not present

## 2017-04-22 DIAGNOSIS — M9903 Segmental and somatic dysfunction of lumbar region: Secondary | ICD-10-CM | POA: Diagnosis not present

## 2017-04-22 DIAGNOSIS — M9904 Segmental and somatic dysfunction of sacral region: Secondary | ICD-10-CM | POA: Diagnosis not present

## 2017-04-22 DIAGNOSIS — M5414 Radiculopathy, thoracic region: Secondary | ICD-10-CM | POA: Diagnosis not present

## 2017-04-22 DIAGNOSIS — M5137 Other intervertebral disc degeneration, lumbosacral region: Secondary | ICD-10-CM | POA: Diagnosis not present

## 2017-04-22 DIAGNOSIS — M9902 Segmental and somatic dysfunction of thoracic region: Secondary | ICD-10-CM | POA: Diagnosis not present

## 2017-04-27 DIAGNOSIS — M5137 Other intervertebral disc degeneration, lumbosacral region: Secondary | ICD-10-CM | POA: Diagnosis not present

## 2017-04-27 DIAGNOSIS — M5414 Radiculopathy, thoracic region: Secondary | ICD-10-CM | POA: Diagnosis not present

## 2017-04-27 DIAGNOSIS — M9903 Segmental and somatic dysfunction of lumbar region: Secondary | ICD-10-CM | POA: Diagnosis not present

## 2017-04-27 DIAGNOSIS — M5417 Radiculopathy, lumbosacral region: Secondary | ICD-10-CM | POA: Diagnosis not present

## 2017-04-27 DIAGNOSIS — M9904 Segmental and somatic dysfunction of sacral region: Secondary | ICD-10-CM | POA: Diagnosis not present

## 2017-04-27 DIAGNOSIS — M9902 Segmental and somatic dysfunction of thoracic region: Secondary | ICD-10-CM | POA: Diagnosis not present

## 2017-04-29 DIAGNOSIS — M5417 Radiculopathy, lumbosacral region: Secondary | ICD-10-CM | POA: Diagnosis not present

## 2017-04-29 DIAGNOSIS — M9903 Segmental and somatic dysfunction of lumbar region: Secondary | ICD-10-CM | POA: Diagnosis not present

## 2017-04-29 DIAGNOSIS — M5137 Other intervertebral disc degeneration, lumbosacral region: Secondary | ICD-10-CM | POA: Diagnosis not present

## 2017-04-29 DIAGNOSIS — M5414 Radiculopathy, thoracic region: Secondary | ICD-10-CM | POA: Diagnosis not present

## 2017-04-29 DIAGNOSIS — M9902 Segmental and somatic dysfunction of thoracic region: Secondary | ICD-10-CM | POA: Diagnosis not present

## 2017-04-29 DIAGNOSIS — M9904 Segmental and somatic dysfunction of sacral region: Secondary | ICD-10-CM | POA: Diagnosis not present

## 2017-05-04 DIAGNOSIS — M5417 Radiculopathy, lumbosacral region: Secondary | ICD-10-CM | POA: Diagnosis not present

## 2017-05-04 DIAGNOSIS — M9903 Segmental and somatic dysfunction of lumbar region: Secondary | ICD-10-CM | POA: Diagnosis not present

## 2017-05-04 DIAGNOSIS — M9904 Segmental and somatic dysfunction of sacral region: Secondary | ICD-10-CM | POA: Diagnosis not present

## 2017-05-04 DIAGNOSIS — M9902 Segmental and somatic dysfunction of thoracic region: Secondary | ICD-10-CM | POA: Diagnosis not present

## 2017-05-04 DIAGNOSIS — M5137 Other intervertebral disc degeneration, lumbosacral region: Secondary | ICD-10-CM | POA: Diagnosis not present

## 2017-05-04 DIAGNOSIS — M5414 Radiculopathy, thoracic region: Secondary | ICD-10-CM | POA: Diagnosis not present

## 2017-05-06 DIAGNOSIS — N189 Chronic kidney disease, unspecified: Secondary | ICD-10-CM | POA: Diagnosis not present

## 2017-05-06 DIAGNOSIS — Z125 Encounter for screening for malignant neoplasm of prostate: Secondary | ICD-10-CM | POA: Diagnosis not present

## 2017-05-06 DIAGNOSIS — E785 Hyperlipidemia, unspecified: Secondary | ICD-10-CM | POA: Diagnosis not present

## 2017-05-06 DIAGNOSIS — M9983 Other biomechanical lesions of lumbar region: Secondary | ICD-10-CM | POA: Diagnosis not present

## 2017-05-06 DIAGNOSIS — Z79899 Other long term (current) drug therapy: Secondary | ICD-10-CM | POA: Diagnosis not present

## 2017-05-06 DIAGNOSIS — R7301 Impaired fasting glucose: Secondary | ICD-10-CM | POA: Diagnosis not present

## 2017-05-06 DIAGNOSIS — Z0000 Encounter for general adult medical examination without abnormal findings: Secondary | ICD-10-CM | POA: Diagnosis not present

## 2017-05-06 DIAGNOSIS — L719 Rosacea, unspecified: Secondary | ICD-10-CM | POA: Diagnosis not present

## 2017-05-08 DIAGNOSIS — S46311D Strain of muscle, fascia and tendon of triceps, right arm, subsequent encounter: Secondary | ICD-10-CM | POA: Diagnosis not present

## 2017-05-08 DIAGNOSIS — M76891 Other specified enthesopathies of right lower limb, excluding foot: Secondary | ICD-10-CM | POA: Diagnosis not present

## 2017-05-13 DIAGNOSIS — M25551 Pain in right hip: Secondary | ICD-10-CM | POA: Diagnosis not present

## 2017-05-13 DIAGNOSIS — M79601 Pain in right arm: Secondary | ICD-10-CM | POA: Diagnosis not present

## 2017-05-19 DIAGNOSIS — M79601 Pain in right arm: Secondary | ICD-10-CM | POA: Diagnosis not present

## 2017-05-19 DIAGNOSIS — M25551 Pain in right hip: Secondary | ICD-10-CM | POA: Diagnosis not present

## 2017-05-22 DIAGNOSIS — M25551 Pain in right hip: Secondary | ICD-10-CM | POA: Diagnosis not present

## 2017-05-22 DIAGNOSIS — M79601 Pain in right arm: Secondary | ICD-10-CM | POA: Diagnosis not present

## 2017-05-22 DIAGNOSIS — S46311D Strain of muscle, fascia and tendon of triceps, right arm, subsequent encounter: Secondary | ICD-10-CM | POA: Diagnosis not present

## 2017-05-22 DIAGNOSIS — M76891 Other specified enthesopathies of right lower limb, excluding foot: Secondary | ICD-10-CM | POA: Diagnosis not present

## 2017-05-26 DIAGNOSIS — M25551 Pain in right hip: Secondary | ICD-10-CM | POA: Diagnosis not present

## 2017-05-26 DIAGNOSIS — M79601 Pain in right arm: Secondary | ICD-10-CM | POA: Diagnosis not present

## 2017-05-28 DIAGNOSIS — M25551 Pain in right hip: Secondary | ICD-10-CM | POA: Diagnosis not present

## 2017-05-28 DIAGNOSIS — M79601 Pain in right arm: Secondary | ICD-10-CM | POA: Diagnosis not present

## 2017-05-28 DIAGNOSIS — M76891 Other specified enthesopathies of right lower limb, excluding foot: Secondary | ICD-10-CM | POA: Diagnosis not present

## 2017-05-28 DIAGNOSIS — S46311D Strain of muscle, fascia and tendon of triceps, right arm, subsequent encounter: Secondary | ICD-10-CM | POA: Diagnosis not present

## 2017-06-02 DIAGNOSIS — M79601 Pain in right arm: Secondary | ICD-10-CM | POA: Diagnosis not present

## 2017-06-02 DIAGNOSIS — M25551 Pain in right hip: Secondary | ICD-10-CM | POA: Diagnosis not present

## 2017-06-04 DIAGNOSIS — M79601 Pain in right arm: Secondary | ICD-10-CM | POA: Diagnosis not present

## 2017-06-04 DIAGNOSIS — S46311D Strain of muscle, fascia and tendon of triceps, right arm, subsequent encounter: Secondary | ICD-10-CM | POA: Diagnosis not present

## 2017-06-04 DIAGNOSIS — M25551 Pain in right hip: Secondary | ICD-10-CM | POA: Diagnosis not present

## 2017-06-04 DIAGNOSIS — M76891 Other specified enthesopathies of right lower limb, excluding foot: Secondary | ICD-10-CM | POA: Diagnosis not present

## 2017-06-09 DIAGNOSIS — M79601 Pain in right arm: Secondary | ICD-10-CM | POA: Diagnosis not present

## 2017-06-09 DIAGNOSIS — M25551 Pain in right hip: Secondary | ICD-10-CM | POA: Diagnosis not present

## 2017-06-10 DIAGNOSIS — M25551 Pain in right hip: Secondary | ICD-10-CM | POA: Diagnosis not present

## 2017-06-11 DIAGNOSIS — M25551 Pain in right hip: Secondary | ICD-10-CM | POA: Diagnosis not present

## 2017-06-11 DIAGNOSIS — M79601 Pain in right arm: Secondary | ICD-10-CM | POA: Diagnosis not present

## 2017-06-16 DIAGNOSIS — M79601 Pain in right arm: Secondary | ICD-10-CM | POA: Diagnosis not present

## 2017-06-16 DIAGNOSIS — M25551 Pain in right hip: Secondary | ICD-10-CM | POA: Diagnosis not present

## 2017-06-18 DIAGNOSIS — M25551 Pain in right hip: Secondary | ICD-10-CM | POA: Diagnosis not present

## 2017-06-18 DIAGNOSIS — M79601 Pain in right arm: Secondary | ICD-10-CM | POA: Diagnosis not present

## 2017-07-02 DIAGNOSIS — M9902 Segmental and somatic dysfunction of thoracic region: Secondary | ICD-10-CM | POA: Diagnosis not present

## 2017-07-02 DIAGNOSIS — M5414 Radiculopathy, thoracic region: Secondary | ICD-10-CM | POA: Diagnosis not present

## 2017-07-02 DIAGNOSIS — M5137 Other intervertebral disc degeneration, lumbosacral region: Secondary | ICD-10-CM | POA: Diagnosis not present

## 2017-07-02 DIAGNOSIS — M9904 Segmental and somatic dysfunction of sacral region: Secondary | ICD-10-CM | POA: Diagnosis not present

## 2017-07-02 DIAGNOSIS — M9903 Segmental and somatic dysfunction of lumbar region: Secondary | ICD-10-CM | POA: Diagnosis not present

## 2017-07-02 DIAGNOSIS — M5417 Radiculopathy, lumbosacral region: Secondary | ICD-10-CM | POA: Diagnosis not present

## 2017-07-06 DIAGNOSIS — M9902 Segmental and somatic dysfunction of thoracic region: Secondary | ICD-10-CM | POA: Diagnosis not present

## 2017-07-06 DIAGNOSIS — M5417 Radiculopathy, lumbosacral region: Secondary | ICD-10-CM | POA: Diagnosis not present

## 2017-07-06 DIAGNOSIS — M5137 Other intervertebral disc degeneration, lumbosacral region: Secondary | ICD-10-CM | POA: Diagnosis not present

## 2017-07-06 DIAGNOSIS — M9904 Segmental and somatic dysfunction of sacral region: Secondary | ICD-10-CM | POA: Diagnosis not present

## 2017-07-06 DIAGNOSIS — M9903 Segmental and somatic dysfunction of lumbar region: Secondary | ICD-10-CM | POA: Diagnosis not present

## 2017-07-06 DIAGNOSIS — M5414 Radiculopathy, thoracic region: Secondary | ICD-10-CM | POA: Diagnosis not present

## 2017-07-08 DIAGNOSIS — M5414 Radiculopathy, thoracic region: Secondary | ICD-10-CM | POA: Diagnosis not present

## 2017-07-08 DIAGNOSIS — M9902 Segmental and somatic dysfunction of thoracic region: Secondary | ICD-10-CM | POA: Diagnosis not present

## 2017-07-08 DIAGNOSIS — M5417 Radiculopathy, lumbosacral region: Secondary | ICD-10-CM | POA: Diagnosis not present

## 2017-07-08 DIAGNOSIS — M9903 Segmental and somatic dysfunction of lumbar region: Secondary | ICD-10-CM | POA: Diagnosis not present

## 2017-07-08 DIAGNOSIS — M9904 Segmental and somatic dysfunction of sacral region: Secondary | ICD-10-CM | POA: Diagnosis not present

## 2017-07-08 DIAGNOSIS — M5137 Other intervertebral disc degeneration, lumbosacral region: Secondary | ICD-10-CM | POA: Diagnosis not present

## 2017-07-13 DIAGNOSIS — M9902 Segmental and somatic dysfunction of thoracic region: Secondary | ICD-10-CM | POA: Diagnosis not present

## 2017-07-13 DIAGNOSIS — M9903 Segmental and somatic dysfunction of lumbar region: Secondary | ICD-10-CM | POA: Diagnosis not present

## 2017-07-13 DIAGNOSIS — M5417 Radiculopathy, lumbosacral region: Secondary | ICD-10-CM | POA: Diagnosis not present

## 2017-07-13 DIAGNOSIS — M5414 Radiculopathy, thoracic region: Secondary | ICD-10-CM | POA: Diagnosis not present

## 2017-07-13 DIAGNOSIS — M9904 Segmental and somatic dysfunction of sacral region: Secondary | ICD-10-CM | POA: Diagnosis not present

## 2017-07-13 DIAGNOSIS — M5137 Other intervertebral disc degeneration, lumbosacral region: Secondary | ICD-10-CM | POA: Diagnosis not present

## 2017-07-15 DIAGNOSIS — M5137 Other intervertebral disc degeneration, lumbosacral region: Secondary | ICD-10-CM | POA: Diagnosis not present

## 2017-07-15 DIAGNOSIS — M9902 Segmental and somatic dysfunction of thoracic region: Secondary | ICD-10-CM | POA: Diagnosis not present

## 2017-07-15 DIAGNOSIS — M5414 Radiculopathy, thoracic region: Secondary | ICD-10-CM | POA: Diagnosis not present

## 2017-07-15 DIAGNOSIS — M5417 Radiculopathy, lumbosacral region: Secondary | ICD-10-CM | POA: Diagnosis not present

## 2017-07-15 DIAGNOSIS — M9904 Segmental and somatic dysfunction of sacral region: Secondary | ICD-10-CM | POA: Diagnosis not present

## 2017-07-15 DIAGNOSIS — M9903 Segmental and somatic dysfunction of lumbar region: Secondary | ICD-10-CM | POA: Diagnosis not present

## 2017-07-17 DIAGNOSIS — M5441 Lumbago with sciatica, right side: Secondary | ICD-10-CM | POA: Diagnosis not present

## 2017-07-20 DIAGNOSIS — M5137 Other intervertebral disc degeneration, lumbosacral region: Secondary | ICD-10-CM | POA: Diagnosis not present

## 2017-07-20 DIAGNOSIS — M9903 Segmental and somatic dysfunction of lumbar region: Secondary | ICD-10-CM | POA: Diagnosis not present

## 2017-07-20 DIAGNOSIS — M5414 Radiculopathy, thoracic region: Secondary | ICD-10-CM | POA: Diagnosis not present

## 2017-07-20 DIAGNOSIS — M9902 Segmental and somatic dysfunction of thoracic region: Secondary | ICD-10-CM | POA: Diagnosis not present

## 2017-07-20 DIAGNOSIS — M5417 Radiculopathy, lumbosacral region: Secondary | ICD-10-CM | POA: Diagnosis not present

## 2017-07-20 DIAGNOSIS — M9904 Segmental and somatic dysfunction of sacral region: Secondary | ICD-10-CM | POA: Diagnosis not present

## 2017-07-22 DIAGNOSIS — M5414 Radiculopathy, thoracic region: Secondary | ICD-10-CM | POA: Diagnosis not present

## 2017-07-22 DIAGNOSIS — M9902 Segmental and somatic dysfunction of thoracic region: Secondary | ICD-10-CM | POA: Diagnosis not present

## 2017-07-22 DIAGNOSIS — M5417 Radiculopathy, lumbosacral region: Secondary | ICD-10-CM | POA: Diagnosis not present

## 2017-07-22 DIAGNOSIS — M9903 Segmental and somatic dysfunction of lumbar region: Secondary | ICD-10-CM | POA: Diagnosis not present

## 2017-07-22 DIAGNOSIS — M5137 Other intervertebral disc degeneration, lumbosacral region: Secondary | ICD-10-CM | POA: Diagnosis not present

## 2017-07-22 DIAGNOSIS — M9904 Segmental and somatic dysfunction of sacral region: Secondary | ICD-10-CM | POA: Diagnosis not present

## 2017-07-24 DIAGNOSIS — M455 Ankylosing spondylitis of thoracolumbar region: Secondary | ICD-10-CM | POA: Diagnosis not present

## 2017-08-04 DIAGNOSIS — M5416 Radiculopathy, lumbar region: Secondary | ICD-10-CM | POA: Diagnosis not present

## 2017-10-01 DIAGNOSIS — H2513 Age-related nuclear cataract, bilateral: Secondary | ICD-10-CM | POA: Diagnosis not present

## 2017-10-01 DIAGNOSIS — H52223 Regular astigmatism, bilateral: Secondary | ICD-10-CM | POA: Diagnosis not present

## 2017-10-01 DIAGNOSIS — H5212 Myopia, left eye: Secondary | ICD-10-CM | POA: Diagnosis not present

## 2017-10-01 DIAGNOSIS — H524 Presbyopia: Secondary | ICD-10-CM | POA: Diagnosis not present

## 2017-11-16 DIAGNOSIS — M545 Low back pain: Secondary | ICD-10-CM | POA: Diagnosis not present

## 2017-11-16 DIAGNOSIS — M5416 Radiculopathy, lumbar region: Secondary | ICD-10-CM | POA: Diagnosis not present

## 2017-11-18 DIAGNOSIS — M47816 Spondylosis without myelopathy or radiculopathy, lumbar region: Secondary | ICD-10-CM | POA: Diagnosis not present

## 2017-11-19 DIAGNOSIS — M47816 Spondylosis without myelopathy or radiculopathy, lumbar region: Secondary | ICD-10-CM | POA: Diagnosis not present

## 2017-11-23 DIAGNOSIS — M47816 Spondylosis without myelopathy or radiculopathy, lumbar region: Secondary | ICD-10-CM | POA: Diagnosis not present

## 2017-11-24 DIAGNOSIS — Z23 Encounter for immunization: Secondary | ICD-10-CM | POA: Diagnosis not present

## 2017-11-25 DIAGNOSIS — M47816 Spondylosis without myelopathy or radiculopathy, lumbar region: Secondary | ICD-10-CM | POA: Diagnosis not present

## 2017-11-30 DIAGNOSIS — M47816 Spondylosis without myelopathy or radiculopathy, lumbar region: Secondary | ICD-10-CM | POA: Diagnosis not present

## 2017-12-02 DIAGNOSIS — M47816 Spondylosis without myelopathy or radiculopathy, lumbar region: Secondary | ICD-10-CM | POA: Diagnosis not present

## 2017-12-07 DIAGNOSIS — M47816 Spondylosis without myelopathy or radiculopathy, lumbar region: Secondary | ICD-10-CM | POA: Diagnosis not present

## 2017-12-09 DIAGNOSIS — M47816 Spondylosis without myelopathy or radiculopathy, lumbar region: Secondary | ICD-10-CM | POA: Diagnosis not present

## 2018-01-21 DIAGNOSIS — K5900 Constipation, unspecified: Secondary | ICD-10-CM | POA: Diagnosis not present

## 2018-02-01 DIAGNOSIS — M2012 Hallux valgus (acquired), left foot: Secondary | ICD-10-CM | POA: Diagnosis not present

## 2018-02-01 DIAGNOSIS — M2011 Hallux valgus (acquired), right foot: Secondary | ICD-10-CM | POA: Diagnosis not present

## 2018-06-23 DIAGNOSIS — R7301 Impaired fasting glucose: Secondary | ICD-10-CM | POA: Diagnosis not present

## 2018-06-23 DIAGNOSIS — Z125 Encounter for screening for malignant neoplasm of prostate: Secondary | ICD-10-CM | POA: Diagnosis not present

## 2018-06-23 DIAGNOSIS — N189 Chronic kidney disease, unspecified: Secondary | ICD-10-CM | POA: Diagnosis not present

## 2018-06-23 DIAGNOSIS — E785 Hyperlipidemia, unspecified: Secondary | ICD-10-CM | POA: Diagnosis not present

## 2018-06-23 DIAGNOSIS — M9983 Other biomechanical lesions of lumbar region: Secondary | ICD-10-CM | POA: Diagnosis not present

## 2018-06-23 DIAGNOSIS — Z79899 Other long term (current) drug therapy: Secondary | ICD-10-CM | POA: Diagnosis not present

## 2018-09-24 DIAGNOSIS — M2012 Hallux valgus (acquired), left foot: Secondary | ICD-10-CM | POA: Diagnosis not present

## 2018-09-24 DIAGNOSIS — M2011 Hallux valgus (acquired), right foot: Secondary | ICD-10-CM | POA: Diagnosis not present

## 2018-10-20 DIAGNOSIS — H52223 Regular astigmatism, bilateral: Secondary | ICD-10-CM | POA: Diagnosis not present

## 2018-10-20 DIAGNOSIS — H524 Presbyopia: Secondary | ICD-10-CM | POA: Diagnosis not present

## 2018-10-20 DIAGNOSIS — H2513 Age-related nuclear cataract, bilateral: Secondary | ICD-10-CM | POA: Diagnosis not present

## 2018-10-20 DIAGNOSIS — H5212 Myopia, left eye: Secondary | ICD-10-CM | POA: Diagnosis not present

## 2018-10-20 DIAGNOSIS — H5201 Hypermetropia, right eye: Secondary | ICD-10-CM | POA: Diagnosis not present

## 2018-11-17 DIAGNOSIS — Z23 Encounter for immunization: Secondary | ICD-10-CM | POA: Diagnosis not present

## 2019-06-06 DIAGNOSIS — Z23 Encounter for immunization: Secondary | ICD-10-CM | POA: Diagnosis not present

## 2019-08-04 DIAGNOSIS — Z79899 Other long term (current) drug therapy: Secondary | ICD-10-CM | POA: Diagnosis not present

## 2019-08-04 DIAGNOSIS — R7301 Impaired fasting glucose: Secondary | ICD-10-CM | POA: Diagnosis not present

## 2019-08-04 DIAGNOSIS — E785 Hyperlipidemia, unspecified: Secondary | ICD-10-CM | POA: Diagnosis not present

## 2019-08-04 DIAGNOSIS — Z125 Encounter for screening for malignant neoplasm of prostate: Secondary | ICD-10-CM | POA: Diagnosis not present

## 2019-08-17 DIAGNOSIS — Z0000 Encounter for general adult medical examination without abnormal findings: Secondary | ICD-10-CM | POA: Diagnosis not present

## 2019-08-17 DIAGNOSIS — M9983 Other biomechanical lesions of lumbar region: Secondary | ICD-10-CM | POA: Diagnosis not present

## 2019-08-17 DIAGNOSIS — Z125 Encounter for screening for malignant neoplasm of prostate: Secondary | ICD-10-CM | POA: Diagnosis not present

## 2019-08-17 DIAGNOSIS — R7303 Prediabetes: Secondary | ICD-10-CM | POA: Diagnosis not present

## 2019-08-17 DIAGNOSIS — Z79899 Other long term (current) drug therapy: Secondary | ICD-10-CM | POA: Diagnosis not present

## 2019-08-17 DIAGNOSIS — N1832 Chronic kidney disease, stage 3b: Secondary | ICD-10-CM | POA: Diagnosis not present

## 2019-08-17 DIAGNOSIS — E785 Hyperlipidemia, unspecified: Secondary | ICD-10-CM | POA: Diagnosis not present

## 2019-08-17 DIAGNOSIS — R8290 Unspecified abnormal findings in urine: Secondary | ICD-10-CM | POA: Diagnosis not present

## 2019-09-22 DIAGNOSIS — H2513 Age-related nuclear cataract, bilateral: Secondary | ICD-10-CM | POA: Diagnosis not present

## 2019-09-22 DIAGNOSIS — H524 Presbyopia: Secondary | ICD-10-CM | POA: Diagnosis not present

## 2019-09-22 DIAGNOSIS — H532 Diplopia: Secondary | ICD-10-CM | POA: Diagnosis not present

## 2019-09-22 DIAGNOSIS — H40013 Open angle with borderline findings, low risk, bilateral: Secondary | ICD-10-CM | POA: Diagnosis not present

## 2019-11-23 DIAGNOSIS — M9902 Segmental and somatic dysfunction of thoracic region: Secondary | ICD-10-CM | POA: Diagnosis not present

## 2019-11-23 DIAGNOSIS — M531 Cervicobrachial syndrome: Secondary | ICD-10-CM | POA: Diagnosis not present

## 2019-11-23 DIAGNOSIS — M9900 Segmental and somatic dysfunction of head region: Secondary | ICD-10-CM | POA: Diagnosis not present

## 2019-11-23 DIAGNOSIS — M5416 Radiculopathy, lumbar region: Secondary | ICD-10-CM | POA: Diagnosis not present

## 2019-11-23 DIAGNOSIS — M9903 Segmental and somatic dysfunction of lumbar region: Secondary | ICD-10-CM | POA: Diagnosis not present

## 2019-11-23 DIAGNOSIS — M5414 Radiculopathy, thoracic region: Secondary | ICD-10-CM | POA: Diagnosis not present

## 2019-11-28 DIAGNOSIS — M5416 Radiculopathy, lumbar region: Secondary | ICD-10-CM | POA: Diagnosis not present

## 2019-11-28 DIAGNOSIS — M9902 Segmental and somatic dysfunction of thoracic region: Secondary | ICD-10-CM | POA: Diagnosis not present

## 2019-11-28 DIAGNOSIS — M9900 Segmental and somatic dysfunction of head region: Secondary | ICD-10-CM | POA: Diagnosis not present

## 2019-11-28 DIAGNOSIS — M9903 Segmental and somatic dysfunction of lumbar region: Secondary | ICD-10-CM | POA: Diagnosis not present

## 2019-11-28 DIAGNOSIS — M531 Cervicobrachial syndrome: Secondary | ICD-10-CM | POA: Diagnosis not present

## 2019-11-28 DIAGNOSIS — M5414 Radiculopathy, thoracic region: Secondary | ICD-10-CM | POA: Diagnosis not present

## 2019-11-30 DIAGNOSIS — M9902 Segmental and somatic dysfunction of thoracic region: Secondary | ICD-10-CM | POA: Diagnosis not present

## 2019-11-30 DIAGNOSIS — M5414 Radiculopathy, thoracic region: Secondary | ICD-10-CM | POA: Diagnosis not present

## 2019-11-30 DIAGNOSIS — M9903 Segmental and somatic dysfunction of lumbar region: Secondary | ICD-10-CM | POA: Diagnosis not present

## 2019-11-30 DIAGNOSIS — M5416 Radiculopathy, lumbar region: Secondary | ICD-10-CM | POA: Diagnosis not present

## 2019-11-30 DIAGNOSIS — M531 Cervicobrachial syndrome: Secondary | ICD-10-CM | POA: Diagnosis not present

## 2019-11-30 DIAGNOSIS — M9900 Segmental and somatic dysfunction of head region: Secondary | ICD-10-CM | POA: Diagnosis not present

## 2019-12-05 DIAGNOSIS — M5414 Radiculopathy, thoracic region: Secondary | ICD-10-CM | POA: Diagnosis not present

## 2019-12-05 DIAGNOSIS — M531 Cervicobrachial syndrome: Secondary | ICD-10-CM | POA: Diagnosis not present

## 2019-12-05 DIAGNOSIS — M9902 Segmental and somatic dysfunction of thoracic region: Secondary | ICD-10-CM | POA: Diagnosis not present

## 2019-12-05 DIAGNOSIS — M9903 Segmental and somatic dysfunction of lumbar region: Secondary | ICD-10-CM | POA: Diagnosis not present

## 2019-12-05 DIAGNOSIS — M5416 Radiculopathy, lumbar region: Secondary | ICD-10-CM | POA: Diagnosis not present

## 2019-12-05 DIAGNOSIS — M9900 Segmental and somatic dysfunction of head region: Secondary | ICD-10-CM | POA: Diagnosis not present

## 2019-12-07 DIAGNOSIS — M5416 Radiculopathy, lumbar region: Secondary | ICD-10-CM | POA: Diagnosis not present

## 2019-12-07 DIAGNOSIS — M9903 Segmental and somatic dysfunction of lumbar region: Secondary | ICD-10-CM | POA: Diagnosis not present

## 2019-12-07 DIAGNOSIS — M9900 Segmental and somatic dysfunction of head region: Secondary | ICD-10-CM | POA: Diagnosis not present

## 2019-12-07 DIAGNOSIS — M9902 Segmental and somatic dysfunction of thoracic region: Secondary | ICD-10-CM | POA: Diagnosis not present

## 2019-12-07 DIAGNOSIS — M5414 Radiculopathy, thoracic region: Secondary | ICD-10-CM | POA: Diagnosis not present

## 2019-12-07 DIAGNOSIS — M531 Cervicobrachial syndrome: Secondary | ICD-10-CM | POA: Diagnosis not present

## 2019-12-12 DIAGNOSIS — M5414 Radiculopathy, thoracic region: Secondary | ICD-10-CM | POA: Diagnosis not present

## 2019-12-12 DIAGNOSIS — M9902 Segmental and somatic dysfunction of thoracic region: Secondary | ICD-10-CM | POA: Diagnosis not present

## 2019-12-12 DIAGNOSIS — M531 Cervicobrachial syndrome: Secondary | ICD-10-CM | POA: Diagnosis not present

## 2019-12-12 DIAGNOSIS — M9903 Segmental and somatic dysfunction of lumbar region: Secondary | ICD-10-CM | POA: Diagnosis not present

## 2019-12-12 DIAGNOSIS — M9900 Segmental and somatic dysfunction of head region: Secondary | ICD-10-CM | POA: Diagnosis not present

## 2019-12-12 DIAGNOSIS — M5416 Radiculopathy, lumbar region: Secondary | ICD-10-CM | POA: Diagnosis not present

## 2019-12-14 DIAGNOSIS — M5414 Radiculopathy, thoracic region: Secondary | ICD-10-CM | POA: Diagnosis not present

## 2019-12-14 DIAGNOSIS — M9900 Segmental and somatic dysfunction of head region: Secondary | ICD-10-CM | POA: Diagnosis not present

## 2019-12-14 DIAGNOSIS — M9903 Segmental and somatic dysfunction of lumbar region: Secondary | ICD-10-CM | POA: Diagnosis not present

## 2019-12-14 DIAGNOSIS — M5416 Radiculopathy, lumbar region: Secondary | ICD-10-CM | POA: Diagnosis not present

## 2019-12-14 DIAGNOSIS — M9902 Segmental and somatic dysfunction of thoracic region: Secondary | ICD-10-CM | POA: Diagnosis not present

## 2019-12-14 DIAGNOSIS — M531 Cervicobrachial syndrome: Secondary | ICD-10-CM | POA: Diagnosis not present

## 2020-01-19 DIAGNOSIS — Z23 Encounter for immunization: Secondary | ICD-10-CM | POA: Diagnosis not present

## 2020-03-29 DIAGNOSIS — H40013 Open angle with borderline findings, low risk, bilateral: Secondary | ICD-10-CM | POA: Diagnosis not present

## 2020-03-29 DIAGNOSIS — H532 Diplopia: Secondary | ICD-10-CM | POA: Diagnosis not present

## 2020-04-12 DIAGNOSIS — Z23 Encounter for immunization: Secondary | ICD-10-CM | POA: Diagnosis not present

## 2020-04-12 DIAGNOSIS — M25552 Pain in left hip: Secondary | ICD-10-CM | POA: Diagnosis not present

## 2020-06-13 DIAGNOSIS — Z23 Encounter for immunization: Secondary | ICD-10-CM | POA: Diagnosis not present

## 2020-08-17 DIAGNOSIS — Z0000 Encounter for general adult medical examination without abnormal findings: Secondary | ICD-10-CM | POA: Diagnosis not present

## 2020-08-17 DIAGNOSIS — Z1389 Encounter for screening for other disorder: Secondary | ICD-10-CM | POA: Diagnosis not present

## 2020-08-21 DIAGNOSIS — E785 Hyperlipidemia, unspecified: Secondary | ICD-10-CM | POA: Diagnosis not present

## 2020-08-21 DIAGNOSIS — R7303 Prediabetes: Secondary | ICD-10-CM | POA: Diagnosis not present

## 2020-08-21 DIAGNOSIS — Z125 Encounter for screening for malignant neoplasm of prostate: Secondary | ICD-10-CM | POA: Diagnosis not present

## 2020-08-21 DIAGNOSIS — G8929 Other chronic pain: Secondary | ICD-10-CM | POA: Diagnosis not present

## 2020-08-21 DIAGNOSIS — N1832 Chronic kidney disease, stage 3b: Secondary | ICD-10-CM | POA: Diagnosis not present

## 2020-08-21 DIAGNOSIS — M5441 Lumbago with sciatica, right side: Secondary | ICD-10-CM | POA: Diagnosis not present

## 2020-08-22 DIAGNOSIS — R7303 Prediabetes: Secondary | ICD-10-CM | POA: Diagnosis not present

## 2020-08-22 DIAGNOSIS — Z125 Encounter for screening for malignant neoplasm of prostate: Secondary | ICD-10-CM | POA: Diagnosis not present

## 2020-08-22 DIAGNOSIS — N1832 Chronic kidney disease, stage 3b: Secondary | ICD-10-CM | POA: Diagnosis not present

## 2020-08-22 DIAGNOSIS — E785 Hyperlipidemia, unspecified: Secondary | ICD-10-CM | POA: Diagnosis not present

## 2020-08-22 DIAGNOSIS — G8929 Other chronic pain: Secondary | ICD-10-CM | POA: Diagnosis not present

## 2020-08-22 DIAGNOSIS — M5441 Lumbago with sciatica, right side: Secondary | ICD-10-CM | POA: Diagnosis not present

## 2020-12-18 DIAGNOSIS — H2513 Age-related nuclear cataract, bilateral: Secondary | ICD-10-CM | POA: Diagnosis not present

## 2020-12-18 DIAGNOSIS — H40013 Open angle with borderline findings, low risk, bilateral: Secondary | ICD-10-CM | POA: Diagnosis not present

## 2020-12-18 DIAGNOSIS — Z23 Encounter for immunization: Secondary | ICD-10-CM | POA: Diagnosis not present

## 2020-12-18 DIAGNOSIS — H35033 Hypertensive retinopathy, bilateral: Secondary | ICD-10-CM | POA: Diagnosis not present

## 2020-12-18 DIAGNOSIS — H25013 Cortical age-related cataract, bilateral: Secondary | ICD-10-CM | POA: Diagnosis not present

## 2021-02-12 DIAGNOSIS — N1832 Chronic kidney disease, stage 3b: Secondary | ICD-10-CM | POA: Diagnosis not present

## 2021-02-12 DIAGNOSIS — K5900 Constipation, unspecified: Secondary | ICD-10-CM | POA: Diagnosis not present

## 2021-05-17 DIAGNOSIS — J029 Acute pharyngitis, unspecified: Secondary | ICD-10-CM | POA: Diagnosis not present

## 2021-05-17 DIAGNOSIS — Z20822 Contact with and (suspected) exposure to covid-19: Secondary | ICD-10-CM | POA: Diagnosis not present

## 2021-05-17 DIAGNOSIS — R059 Cough, unspecified: Secondary | ICD-10-CM | POA: Diagnosis not present

## 2021-05-17 DIAGNOSIS — N1832 Chronic kidney disease, stage 3b: Secondary | ICD-10-CM | POA: Diagnosis not present

## 2021-05-17 DIAGNOSIS — J320 Chronic maxillary sinusitis: Secondary | ICD-10-CM | POA: Diagnosis not present

## 2021-05-17 DIAGNOSIS — K5900 Constipation, unspecified: Secondary | ICD-10-CM | POA: Diagnosis not present

## 2021-06-18 DIAGNOSIS — H40013 Open angle with borderline findings, low risk, bilateral: Secondary | ICD-10-CM | POA: Diagnosis not present

## 2021-08-20 DIAGNOSIS — Z125 Encounter for screening for malignant neoplasm of prostate: Secondary | ICD-10-CM | POA: Diagnosis not present

## 2021-08-20 DIAGNOSIS — Z0000 Encounter for general adult medical examination without abnormal findings: Secondary | ICD-10-CM | POA: Diagnosis not present

## 2021-08-20 DIAGNOSIS — N1832 Chronic kidney disease, stage 3b: Secondary | ICD-10-CM | POA: Diagnosis not present

## 2021-08-20 DIAGNOSIS — Z23 Encounter for immunization: Secondary | ICD-10-CM | POA: Diagnosis not present

## 2021-08-20 DIAGNOSIS — Z1389 Encounter for screening for other disorder: Secondary | ICD-10-CM | POA: Diagnosis not present

## 2021-08-20 DIAGNOSIS — R7303 Prediabetes: Secondary | ICD-10-CM | POA: Diagnosis not present

## 2021-08-20 DIAGNOSIS — E785 Hyperlipidemia, unspecified: Secondary | ICD-10-CM | POA: Diagnosis not present

## 2021-09-17 DIAGNOSIS — R7303 Prediabetes: Secondary | ICD-10-CM | POA: Diagnosis not present

## 2021-09-17 DIAGNOSIS — N1832 Chronic kidney disease, stage 3b: Secondary | ICD-10-CM | POA: Diagnosis not present

## 2021-09-17 DIAGNOSIS — M79673 Pain in unspecified foot: Secondary | ICD-10-CM | POA: Diagnosis not present

## 2021-11-29 DIAGNOSIS — Z23 Encounter for immunization: Secondary | ICD-10-CM | POA: Diagnosis not present

## 2022-01-01 DIAGNOSIS — M722 Plantar fascial fibromatosis: Secondary | ICD-10-CM | POA: Diagnosis not present

## 2022-01-01 DIAGNOSIS — M21861 Other specified acquired deformities of right lower leg: Secondary | ICD-10-CM | POA: Diagnosis not present

## 2022-01-01 DIAGNOSIS — M792 Neuralgia and neuritis, unspecified: Secondary | ICD-10-CM | POA: Diagnosis not present

## 2022-01-01 DIAGNOSIS — M21862 Other specified acquired deformities of left lower leg: Secondary | ICD-10-CM | POA: Diagnosis not present

## 2022-01-01 DIAGNOSIS — M25371 Other instability, right ankle: Secondary | ICD-10-CM | POA: Diagnosis not present

## 2022-01-01 DIAGNOSIS — M25372 Other instability, left ankle: Secondary | ICD-10-CM | POA: Diagnosis not present

## 2022-01-03 DIAGNOSIS — H532 Diplopia: Secondary | ICD-10-CM | POA: Diagnosis not present

## 2022-01-03 DIAGNOSIS — H524 Presbyopia: Secondary | ICD-10-CM | POA: Diagnosis not present

## 2022-01-03 DIAGNOSIS — H40013 Open angle with borderline findings, low risk, bilateral: Secondary | ICD-10-CM | POA: Diagnosis not present

## 2022-01-03 DIAGNOSIS — H25813 Combined forms of age-related cataract, bilateral: Secondary | ICD-10-CM | POA: Diagnosis not present

## 2022-01-03 DIAGNOSIS — H35031 Hypertensive retinopathy, right eye: Secondary | ICD-10-CM | POA: Diagnosis not present

## 2022-01-06 DIAGNOSIS — K649 Unspecified hemorrhoids: Secondary | ICD-10-CM | POA: Diagnosis not present

## 2022-01-06 DIAGNOSIS — K5900 Constipation, unspecified: Secondary | ICD-10-CM | POA: Diagnosis not present

## 2022-01-06 DIAGNOSIS — K625 Hemorrhage of anus and rectum: Secondary | ICD-10-CM | POA: Diagnosis not present

## 2022-01-21 DIAGNOSIS — K5730 Diverticulosis of large intestine without perforation or abscess without bleeding: Secondary | ICD-10-CM | POA: Diagnosis not present

## 2022-01-21 DIAGNOSIS — K644 Residual hemorrhoidal skin tags: Secondary | ICD-10-CM | POA: Diagnosis not present

## 2022-01-21 DIAGNOSIS — Z1211 Encounter for screening for malignant neoplasm of colon: Secondary | ICD-10-CM | POA: Diagnosis not present

## 2022-01-21 DIAGNOSIS — D122 Benign neoplasm of ascending colon: Secondary | ICD-10-CM | POA: Diagnosis not present

## 2022-01-21 DIAGNOSIS — D120 Benign neoplasm of cecum: Secondary | ICD-10-CM | POA: Diagnosis not present

## 2022-01-23 DIAGNOSIS — D120 Benign neoplasm of cecum: Secondary | ICD-10-CM | POA: Diagnosis not present

## 2022-01-23 DIAGNOSIS — D122 Benign neoplasm of ascending colon: Secondary | ICD-10-CM | POA: Diagnosis not present

## 2022-01-29 DIAGNOSIS — M722 Plantar fascial fibromatosis: Secondary | ICD-10-CM | POA: Diagnosis not present

## 2022-01-29 DIAGNOSIS — M25371 Other instability, right ankle: Secondary | ICD-10-CM | POA: Diagnosis not present

## 2022-01-29 DIAGNOSIS — M21862 Other specified acquired deformities of left lower leg: Secondary | ICD-10-CM | POA: Diagnosis not present

## 2022-01-29 DIAGNOSIS — M25372 Other instability, left ankle: Secondary | ICD-10-CM | POA: Diagnosis not present

## 2022-01-29 DIAGNOSIS — M21861 Other specified acquired deformities of right lower leg: Secondary | ICD-10-CM | POA: Diagnosis not present

## 2022-01-29 DIAGNOSIS — M792 Neuralgia and neuritis, unspecified: Secondary | ICD-10-CM | POA: Diagnosis not present

## 2022-02-11 DIAGNOSIS — D122 Benign neoplasm of ascending colon: Secondary | ICD-10-CM | POA: Diagnosis not present

## 2022-02-11 DIAGNOSIS — K5730 Diverticulosis of large intestine without perforation or abscess without bleeding: Secondary | ICD-10-CM | POA: Diagnosis not present

## 2022-02-11 DIAGNOSIS — D120 Benign neoplasm of cecum: Secondary | ICD-10-CM | POA: Diagnosis not present

## 2022-02-11 DIAGNOSIS — K644 Residual hemorrhoidal skin tags: Secondary | ICD-10-CM | POA: Diagnosis not present

## 2022-02-11 DIAGNOSIS — Z1211 Encounter for screening for malignant neoplasm of colon: Secondary | ICD-10-CM | POA: Diagnosis not present

## 2022-03-14 DIAGNOSIS — M25371 Other instability, right ankle: Secondary | ICD-10-CM | POA: Diagnosis not present

## 2022-03-14 DIAGNOSIS — M21862 Other specified acquired deformities of left lower leg: Secondary | ICD-10-CM | POA: Diagnosis not present

## 2022-03-14 DIAGNOSIS — M792 Neuralgia and neuritis, unspecified: Secondary | ICD-10-CM | POA: Diagnosis not present

## 2022-03-14 DIAGNOSIS — M25372 Other instability, left ankle: Secondary | ICD-10-CM | POA: Diagnosis not present

## 2022-03-14 DIAGNOSIS — M21861 Other specified acquired deformities of right lower leg: Secondary | ICD-10-CM | POA: Diagnosis not present

## 2022-03-14 DIAGNOSIS — M722 Plantar fascial fibromatosis: Secondary | ICD-10-CM | POA: Diagnosis not present

## 2022-04-03 DIAGNOSIS — H04123 Dry eye syndrome of bilateral lacrimal glands: Secondary | ICD-10-CM | POA: Diagnosis not present

## 2022-04-16 DIAGNOSIS — M25562 Pain in left knee: Secondary | ICD-10-CM | POA: Diagnosis not present

## 2022-06-02 DIAGNOSIS — H16011 Central corneal ulcer, right eye: Secondary | ICD-10-CM | POA: Diagnosis not present

## 2022-06-02 DIAGNOSIS — H04123 Dry eye syndrome of bilateral lacrimal glands: Secondary | ICD-10-CM | POA: Diagnosis not present

## 2022-06-06 DIAGNOSIS — H04123 Dry eye syndrome of bilateral lacrimal glands: Secondary | ICD-10-CM | POA: Diagnosis not present

## 2022-06-06 DIAGNOSIS — H16011 Central corneal ulcer, right eye: Secondary | ICD-10-CM | POA: Diagnosis not present

## 2022-09-01 DIAGNOSIS — Z1389 Encounter for screening for other disorder: Secondary | ICD-10-CM | POA: Diagnosis not present

## 2022-09-01 DIAGNOSIS — Z0000 Encounter for general adult medical examination without abnormal findings: Secondary | ICD-10-CM | POA: Diagnosis not present

## 2022-09-23 DIAGNOSIS — N189 Chronic kidney disease, unspecified: Secondary | ICD-10-CM | POA: Diagnosis not present

## 2022-09-23 DIAGNOSIS — Z125 Encounter for screening for malignant neoplasm of prostate: Secondary | ICD-10-CM | POA: Diagnosis not present

## 2022-09-23 DIAGNOSIS — M25562 Pain in left knee: Secondary | ICD-10-CM | POA: Diagnosis not present

## 2022-09-23 DIAGNOSIS — E785 Hyperlipidemia, unspecified: Secondary | ICD-10-CM | POA: Diagnosis not present

## 2022-09-23 DIAGNOSIS — R7303 Prediabetes: Secondary | ICD-10-CM | POA: Diagnosis not present

## 2022-09-30 DIAGNOSIS — M25562 Pain in left knee: Secondary | ICD-10-CM | POA: Diagnosis not present

## 2022-11-12 DIAGNOSIS — M1712 Unilateral primary osteoarthritis, left knee: Secondary | ICD-10-CM | POA: Diagnosis not present

## 2022-11-28 DIAGNOSIS — M1712 Unilateral primary osteoarthritis, left knee: Secondary | ICD-10-CM | POA: Diagnosis not present

## 2022-12-02 DIAGNOSIS — M1712 Unilateral primary osteoarthritis, left knee: Secondary | ICD-10-CM | POA: Diagnosis not present

## 2022-12-10 DIAGNOSIS — M1712 Unilateral primary osteoarthritis, left knee: Secondary | ICD-10-CM | POA: Diagnosis not present

## 2022-12-17 DIAGNOSIS — M1712 Unilateral primary osteoarthritis, left knee: Secondary | ICD-10-CM | POA: Diagnosis not present

## 2022-12-25 DIAGNOSIS — M1712 Unilateral primary osteoarthritis, left knee: Secondary | ICD-10-CM | POA: Diagnosis not present

## 2022-12-31 DIAGNOSIS — Z23 Encounter for immunization: Secondary | ICD-10-CM | POA: Diagnosis not present

## 2023-01-08 DIAGNOSIS — H04123 Dry eye syndrome of bilateral lacrimal glands: Secondary | ICD-10-CM | POA: Diagnosis not present

## 2023-01-08 DIAGNOSIS — H40013 Open angle with borderline findings, low risk, bilateral: Secondary | ICD-10-CM | POA: Diagnosis not present

## 2023-01-08 DIAGNOSIS — H25813 Combined forms of age-related cataract, bilateral: Secondary | ICD-10-CM | POA: Diagnosis not present

## 2023-01-08 DIAGNOSIS — H532 Diplopia: Secondary | ICD-10-CM | POA: Diagnosis not present

## 2023-01-09 DIAGNOSIS — M1712 Unilateral primary osteoarthritis, left knee: Secondary | ICD-10-CM | POA: Diagnosis not present

## 2023-01-21 DIAGNOSIS — M25562 Pain in left knee: Secondary | ICD-10-CM | POA: Diagnosis not present

## 2023-05-18 DIAGNOSIS — N189 Chronic kidney disease, unspecified: Secondary | ICD-10-CM | POA: Diagnosis not present

## 2023-06-08 DIAGNOSIS — E785 Hyperlipidemia, unspecified: Secondary | ICD-10-CM | POA: Diagnosis not present

## 2023-06-08 DIAGNOSIS — M1712 Unilateral primary osteoarthritis, left knee: Secondary | ICD-10-CM | POA: Diagnosis not present

## 2023-06-08 DIAGNOSIS — N189 Chronic kidney disease, unspecified: Secondary | ICD-10-CM | POA: Diagnosis not present

## 2023-06-18 DIAGNOSIS — N189 Chronic kidney disease, unspecified: Secondary | ICD-10-CM | POA: Diagnosis not present

## 2023-07-08 DIAGNOSIS — N189 Chronic kidney disease, unspecified: Secondary | ICD-10-CM | POA: Diagnosis not present

## 2023-07-08 DIAGNOSIS — M1712 Unilateral primary osteoarthritis, left knee: Secondary | ICD-10-CM | POA: Diagnosis not present

## 2023-07-08 DIAGNOSIS — E785 Hyperlipidemia, unspecified: Secondary | ICD-10-CM | POA: Diagnosis not present

## 2023-07-20 DIAGNOSIS — N189 Chronic kidney disease, unspecified: Secondary | ICD-10-CM | POA: Diagnosis not present

## 2023-08-08 DIAGNOSIS — M1712 Unilateral primary osteoarthritis, left knee: Secondary | ICD-10-CM | POA: Diagnosis not present

## 2023-08-08 DIAGNOSIS — E785 Hyperlipidemia, unspecified: Secondary | ICD-10-CM | POA: Diagnosis not present

## 2023-08-08 DIAGNOSIS — N189 Chronic kidney disease, unspecified: Secondary | ICD-10-CM | POA: Diagnosis not present

## 2023-08-30 DIAGNOSIS — N189 Chronic kidney disease, unspecified: Secondary | ICD-10-CM | POA: Diagnosis not present

## 2023-09-18 DIAGNOSIS — Z125 Encounter for screening for malignant neoplasm of prostate: Secondary | ICD-10-CM | POA: Diagnosis not present

## 2023-09-18 DIAGNOSIS — R7303 Prediabetes: Secondary | ICD-10-CM | POA: Diagnosis not present

## 2023-09-18 DIAGNOSIS — Z6823 Body mass index (BMI) 23.0-23.9, adult: Secondary | ICD-10-CM | POA: Diagnosis not present

## 2023-09-18 DIAGNOSIS — N1832 Chronic kidney disease, stage 3b: Secondary | ICD-10-CM | POA: Diagnosis not present

## 2023-09-18 DIAGNOSIS — E785 Hyperlipidemia, unspecified: Secondary | ICD-10-CM | POA: Diagnosis not present

## 2023-09-21 DIAGNOSIS — Z23 Encounter for immunization: Secondary | ICD-10-CM | POA: Diagnosis not present

## 2023-09-21 DIAGNOSIS — Z0000 Encounter for general adult medical examination without abnormal findings: Secondary | ICD-10-CM | POA: Diagnosis not present

## 2023-09-21 DIAGNOSIS — Z1389 Encounter for screening for other disorder: Secondary | ICD-10-CM | POA: Diagnosis not present

## 2023-12-14 DIAGNOSIS — Z23 Encounter for immunization: Secondary | ICD-10-CM | POA: Diagnosis not present

## 2024-01-14 DIAGNOSIS — H25813 Combined forms of age-related cataract, bilateral: Secondary | ICD-10-CM | POA: Diagnosis not present

## 2024-01-14 DIAGNOSIS — H40013 Open angle with borderline findings, low risk, bilateral: Secondary | ICD-10-CM | POA: Diagnosis not present

## 2024-02-10 DIAGNOSIS — L989 Disorder of the skin and subcutaneous tissue, unspecified: Secondary | ICD-10-CM | POA: Diagnosis not present

## 2024-02-10 DIAGNOSIS — K5900 Constipation, unspecified: Secondary | ICD-10-CM | POA: Diagnosis not present

## 2024-02-10 DIAGNOSIS — Z6823 Body mass index (BMI) 23.0-23.9, adult: Secondary | ICD-10-CM | POA: Diagnosis not present
